# Patient Record
Sex: Female | Born: 1935 | Race: White | Hispanic: No | Smoking: Never smoker
Health system: Southern US, Community
[De-identification: ages and names within clinical notes are randomized; demographics above are authoritative.]

## PROBLEM LIST (undated history)

## (undated) DIAGNOSIS — K219 Gastro-esophageal reflux disease without esophagitis: Secondary | ICD-10-CM

## (undated) DIAGNOSIS — E785 Hyperlipidemia, unspecified: Secondary | ICD-10-CM

## (undated) DIAGNOSIS — I341 Nonrheumatic mitral (valve) prolapse: Secondary | ICD-10-CM

## (undated) DIAGNOSIS — N289 Disorder of kidney and ureter, unspecified: Secondary | ICD-10-CM

## (undated) DIAGNOSIS — IMO0001 Reserved for inherently not codable concepts without codable children: Secondary | ICD-10-CM

## (undated) DIAGNOSIS — E039 Hypothyroidism, unspecified: Secondary | ICD-10-CM

## (undated) DIAGNOSIS — F0390 Unspecified dementia without behavioral disturbance: Secondary | ICD-10-CM

## (undated) DIAGNOSIS — R51 Headache: Secondary | ICD-10-CM

## (undated) DIAGNOSIS — I10 Essential (primary) hypertension: Secondary | ICD-10-CM

## (undated) DIAGNOSIS — R519 Headache, unspecified: Secondary | ICD-10-CM

## (undated) HISTORY — DX: Nonrheumatic mitral (valve) prolapse: I34.1

## (undated) HISTORY — DX: Unspecified dementia, unspecified severity, without behavioral disturbance, psychotic disturbance, mood disturbance, and anxiety: F03.90

## (undated) HISTORY — DX: Headache: R51

## (undated) HISTORY — DX: Hyperlipidemia, unspecified: E78.5

## (undated) HISTORY — DX: Essential (primary) hypertension: I10

## (undated) HISTORY — DX: Headache, unspecified: R51.9

## (undated) HISTORY — DX: Reserved for inherently not codable concepts without codable children: IMO0001

## (undated) HISTORY — DX: Hypothyroidism, unspecified: E03.9

## (undated) HISTORY — DX: Gastro-esophageal reflux disease without esophagitis: K21.9

---

## 1998-10-13 ENCOUNTER — Encounter: Payer: Self-pay | Admitting: Geriatric Medicine

## 1998-10-13 ENCOUNTER — Ambulatory Visit (HOSPITAL_COMMUNITY): Admission: RE | Admit: 1998-10-13 | Discharge: 1998-10-13 | Payer: Self-pay | Admitting: Geriatric Medicine

## 1998-11-27 ENCOUNTER — Emergency Department (HOSPITAL_COMMUNITY): Admission: EM | Admit: 1998-11-27 | Discharge: 1998-11-27 | Payer: Self-pay | Admitting: Emergency Medicine

## 1999-03-24 ENCOUNTER — Emergency Department (HOSPITAL_COMMUNITY): Admission: EM | Admit: 1999-03-24 | Discharge: 1999-03-24 | Payer: Self-pay | Admitting: Emergency Medicine

## 1999-03-24 ENCOUNTER — Encounter: Payer: Self-pay | Admitting: Emergency Medicine

## 1999-08-17 ENCOUNTER — Encounter: Payer: Self-pay | Admitting: Emergency Medicine

## 1999-08-17 ENCOUNTER — Inpatient Hospital Stay (HOSPITAL_COMMUNITY): Admission: EM | Admit: 1999-08-17 | Discharge: 1999-08-18 | Payer: Self-pay | Admitting: *Deleted

## 1999-12-19 ENCOUNTER — Ambulatory Visit (HOSPITAL_COMMUNITY): Admission: RE | Admit: 1999-12-19 | Discharge: 1999-12-19 | Payer: Self-pay | Admitting: Cardiology

## 1999-12-19 ENCOUNTER — Encounter: Payer: Self-pay | Admitting: Cardiology

## 2000-02-13 ENCOUNTER — Encounter: Payer: Self-pay | Admitting: Emergency Medicine

## 2000-02-13 ENCOUNTER — Inpatient Hospital Stay (HOSPITAL_COMMUNITY): Admission: EM | Admit: 2000-02-13 | Discharge: 2000-02-14 | Payer: Self-pay | Admitting: Emergency Medicine

## 2001-07-18 ENCOUNTER — Encounter: Payer: Self-pay | Admitting: Emergency Medicine

## 2001-07-18 ENCOUNTER — Emergency Department (HOSPITAL_COMMUNITY): Admission: EM | Admit: 2001-07-18 | Discharge: 2001-07-18 | Payer: Self-pay | Admitting: Emergency Medicine

## 2001-11-25 ENCOUNTER — Emergency Department (HOSPITAL_COMMUNITY): Admission: EM | Admit: 2001-11-25 | Discharge: 2001-11-25 | Payer: Self-pay | Admitting: Emergency Medicine

## 2001-11-25 ENCOUNTER — Encounter: Payer: Self-pay | Admitting: Emergency Medicine

## 2002-03-24 ENCOUNTER — Encounter: Payer: Self-pay | Admitting: Orthopaedic Surgery

## 2002-03-30 ENCOUNTER — Inpatient Hospital Stay (HOSPITAL_COMMUNITY): Admission: RE | Admit: 2002-03-30 | Discharge: 2002-04-06 | Payer: Self-pay | Admitting: Orthopaedic Surgery

## 2002-04-06 ENCOUNTER — Inpatient Hospital Stay (HOSPITAL_COMMUNITY)
Admission: AD | Admit: 2002-04-06 | Discharge: 2002-04-10 | Payer: Self-pay | Admitting: Physical Medicine & Rehabilitation

## 2002-08-14 ENCOUNTER — Emergency Department (HOSPITAL_COMMUNITY): Admission: EM | Admit: 2002-08-14 | Discharge: 2002-08-14 | Payer: Self-pay | Admitting: Emergency Medicine

## 2002-08-19 ENCOUNTER — Inpatient Hospital Stay (HOSPITAL_COMMUNITY): Admission: RE | Admit: 2002-08-19 | Discharge: 2002-08-27 | Payer: Self-pay | Admitting: Cardiology

## 2002-08-19 ENCOUNTER — Encounter: Payer: Self-pay | Admitting: Cardiology

## 2002-08-20 ENCOUNTER — Encounter: Payer: Self-pay | Admitting: Cardiology

## 2002-08-21 ENCOUNTER — Encounter: Payer: Self-pay | Admitting: Gastroenterology

## 2002-08-30 ENCOUNTER — Emergency Department (HOSPITAL_COMMUNITY): Admission: EM | Admit: 2002-08-30 | Discharge: 2002-08-30 | Payer: Self-pay | Admitting: Emergency Medicine

## 2003-01-02 ENCOUNTER — Encounter: Payer: Self-pay | Admitting: *Deleted

## 2003-01-02 ENCOUNTER — Emergency Department (HOSPITAL_COMMUNITY): Admission: EM | Admit: 2003-01-02 | Discharge: 2003-01-02 | Payer: Self-pay | Admitting: *Deleted

## 2003-03-26 ENCOUNTER — Encounter: Payer: Self-pay | Admitting: Emergency Medicine

## 2003-03-26 ENCOUNTER — Emergency Department (HOSPITAL_COMMUNITY): Admission: EM | Admit: 2003-03-26 | Discharge: 2003-03-26 | Payer: Self-pay | Admitting: Emergency Medicine

## 2003-07-06 ENCOUNTER — Emergency Department (HOSPITAL_COMMUNITY): Admission: EM | Admit: 2003-07-06 | Discharge: 2003-07-06 | Payer: Self-pay | Admitting: Emergency Medicine

## 2003-07-06 ENCOUNTER — Encounter: Payer: Self-pay | Admitting: Emergency Medicine

## 2004-03-22 ENCOUNTER — Emergency Department (HOSPITAL_COMMUNITY): Admission: EM | Admit: 2004-03-22 | Discharge: 2004-03-22 | Payer: Self-pay | Admitting: Emergency Medicine

## 2004-05-06 ENCOUNTER — Inpatient Hospital Stay (HOSPITAL_COMMUNITY): Admission: EM | Admit: 2004-05-06 | Discharge: 2004-05-08 | Payer: Self-pay | Admitting: Emergency Medicine

## 2004-07-30 ENCOUNTER — Emergency Department (HOSPITAL_COMMUNITY): Admission: EM | Admit: 2004-07-30 | Discharge: 2004-07-30 | Payer: Self-pay | Admitting: Emergency Medicine

## 2004-12-21 ENCOUNTER — Emergency Department (HOSPITAL_COMMUNITY): Admission: EM | Admit: 2004-12-21 | Discharge: 2004-12-21 | Payer: Self-pay | Admitting: Emergency Medicine

## 2005-01-22 ENCOUNTER — Emergency Department (HOSPITAL_COMMUNITY): Admission: EM | Admit: 2005-01-22 | Discharge: 2005-01-22 | Payer: Self-pay | Admitting: Emergency Medicine

## 2005-03-15 ENCOUNTER — Inpatient Hospital Stay (HOSPITAL_COMMUNITY): Admission: EM | Admit: 2005-03-15 | Discharge: 2005-03-20 | Payer: Self-pay | Admitting: Emergency Medicine

## 2005-10-15 ENCOUNTER — Emergency Department (HOSPITAL_COMMUNITY): Admission: EM | Admit: 2005-10-15 | Discharge: 2005-10-16 | Payer: Self-pay | Admitting: Emergency Medicine

## 2005-11-03 ENCOUNTER — Observation Stay (HOSPITAL_COMMUNITY): Admission: EM | Admit: 2005-11-03 | Discharge: 2005-11-04 | Payer: Self-pay | Admitting: Emergency Medicine

## 2007-06-02 ENCOUNTER — Encounter (INDEPENDENT_AMBULATORY_CARE_PROVIDER_SITE_OTHER): Payer: Self-pay | Admitting: Gastroenterology

## 2007-06-02 ENCOUNTER — Inpatient Hospital Stay (HOSPITAL_COMMUNITY): Admission: EM | Admit: 2007-06-02 | Discharge: 2007-06-09 | Payer: Self-pay | Admitting: Emergency Medicine

## 2007-06-12 ENCOUNTER — Emergency Department (HOSPITAL_COMMUNITY): Admission: EM | Admit: 2007-06-12 | Discharge: 2007-06-12 | Payer: Self-pay | Admitting: Emergency Medicine

## 2007-07-18 ENCOUNTER — Emergency Department (HOSPITAL_COMMUNITY): Admission: EM | Admit: 2007-07-18 | Discharge: 2007-07-18 | Payer: Self-pay | Admitting: Emergency Medicine

## 2008-08-07 IMAGING — CR DG CHEST 1V PORT
1 series · 1 of 1 positions shown · non-contrast
Comparison: 11/03/05.

CLINICAL DATA: 76-year-old, G.I. bleed. 
 PORTABLE CHEST:

[view not recorded]
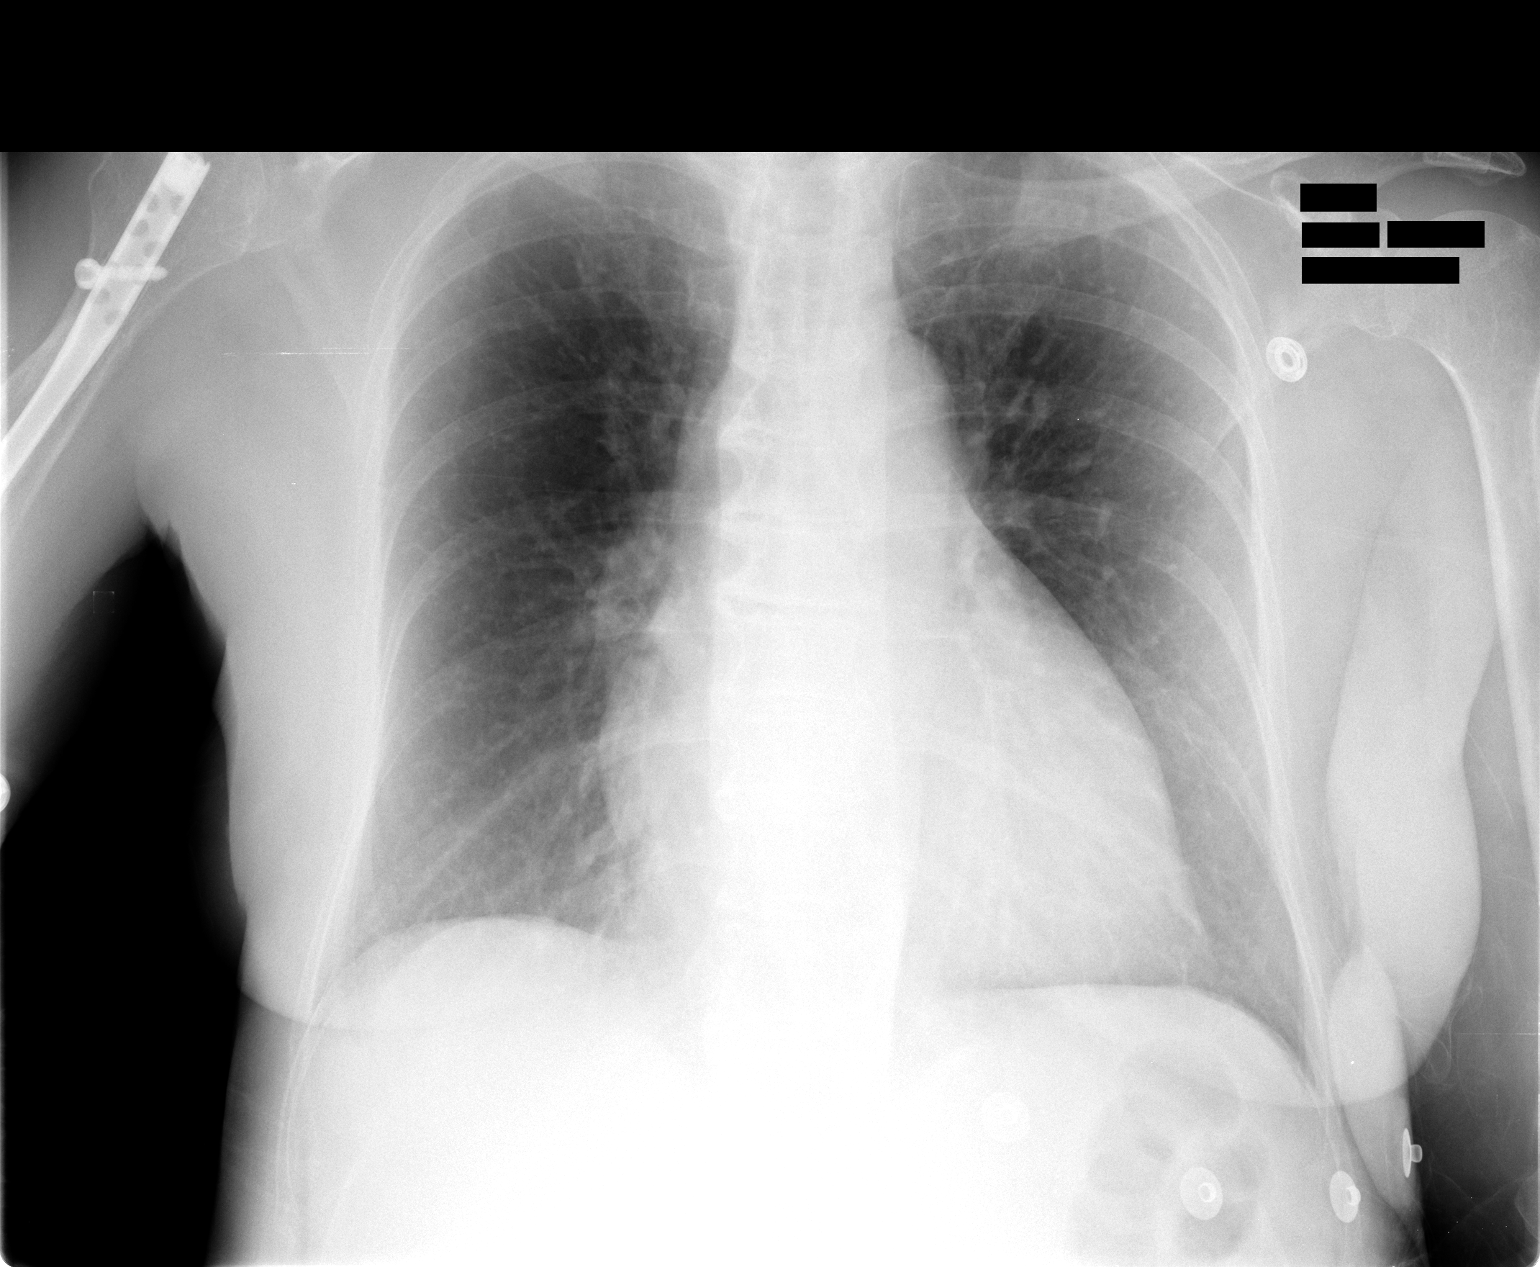

[1 of 1 positions shown; findings below may reference images not displayed]

FINDINGS: Heart is borderline in size.  Mediastinal and hilar contours are within normal limits.  Mild chronic lung changes/COPD but no acute pulmonary findings.  Slight increased density associated with both 1st ribs is a stable finding.  An intramedullary rod is noted in the right humerus.
IMPRESSION: 1.  Borderline heart size.
 2.  Chronic lung changes but no acute pulmonary findings.

## 2009-07-28 ENCOUNTER — Emergency Department (HOSPITAL_COMMUNITY): Admission: EM | Admit: 2009-07-28 | Discharge: 2009-07-29 | Payer: Self-pay | Admitting: Emergency Medicine

## 2009-12-23 ENCOUNTER — Observation Stay (HOSPITAL_COMMUNITY): Admission: EM | Admit: 2009-12-23 | Discharge: 2009-12-24 | Payer: Self-pay | Admitting: Emergency Medicine

## 2010-06-18 ENCOUNTER — Ambulatory Visit: Payer: Self-pay | Admitting: Cardiology

## 2010-11-30 ENCOUNTER — Encounter: Payer: Self-pay | Admitting: Cardiology

## 2010-11-30 DIAGNOSIS — E785 Hyperlipidemia, unspecified: Secondary | ICD-10-CM | POA: Insufficient documentation

## 2010-11-30 DIAGNOSIS — I341 Nonrheumatic mitral (valve) prolapse: Secondary | ICD-10-CM | POA: Insufficient documentation

## 2010-11-30 DIAGNOSIS — F0390 Unspecified dementia without behavioral disturbance: Secondary | ICD-10-CM | POA: Insufficient documentation

## 2010-11-30 DIAGNOSIS — I10 Essential (primary) hypertension: Secondary | ICD-10-CM | POA: Insufficient documentation

## 2010-11-30 DIAGNOSIS — E039 Hypothyroidism, unspecified: Secondary | ICD-10-CM | POA: Insufficient documentation

## 2010-12-19 ENCOUNTER — Ambulatory Visit: Payer: Self-pay | Admitting: Cardiology

## 2011-02-08 LAB — URINE CULTURE: Culture: NO GROWTH

## 2011-02-08 LAB — POCT I-STAT, CHEM 8
Calcium, Ion: 1.06 mmol/L — ABNORMAL LOW (ref 1.12–1.32)
Chloride: 105 mEq/L (ref 96–112)
Glucose, Bld: 116 mg/dL — ABNORMAL HIGH (ref 70–99)
HCT: 44 % (ref 36.0–46.0)
Hemoglobin: 15 g/dL (ref 12.0–15.0)
TCO2: 28 mmol/L (ref 0–100)

## 2011-02-08 LAB — URINALYSIS, ROUTINE W REFLEX MICROSCOPIC
Ketones, ur: 15 mg/dL — AB
Nitrite: NEGATIVE
Protein, ur: 30 mg/dL — AB
pH: 6.5 (ref 5.0–8.0)

## 2011-02-22 LAB — POCT I-STAT, CHEM 8
BUN: 27 mg/dL — ABNORMAL HIGH (ref 6–23)
Calcium, Ion: 1.1 mmol/L — ABNORMAL LOW (ref 1.12–1.32)
Chloride: 106 mEq/L (ref 96–112)
Creatinine, Ser: 0.8 mg/dL (ref 0.4–1.2)
Glucose, Bld: 110 mg/dL — ABNORMAL HIGH (ref 70–99)
HCT: 41 % (ref 36.0–46.0)
Hemoglobin: 13.9 g/dL (ref 12.0–15.0)
Potassium: 4 meq/L (ref 3.5–5.1)
Sodium: 139 mEq/L (ref 135–145)
TCO2: 25 mmol/L (ref 0–100)

## 2011-03-13 ENCOUNTER — Other Ambulatory Visit: Payer: Self-pay | Admitting: *Deleted

## 2011-03-13 DIAGNOSIS — R609 Edema, unspecified: Secondary | ICD-10-CM

## 2011-03-13 MED ORDER — FUROSEMIDE 20 MG PO TABS: ORAL_TABLET | ORAL | Status: AC

## 2011-03-13 NOTE — Telephone Encounter (Signed)
Received a fax from [facility] regarding patient

## 2011-03-15 ENCOUNTER — Encounter: Payer: Self-pay | Admitting: Cardiology

## 2011-04-02 NOTE — Consult Note (Signed)
NAMECHARLIE, CHAR                ACCOUNT NO.:  [id number]   MEDICAL RECORD NO.:  [id number]          PATIENT TYPE:  INP   LOCATION:  5120                         FACILITY:  [facility]   PHYSICIAN:  Antonietta Breach, M.D.  DATE OF BIRTH:  05/04/1930   DATE OF CONSULTATION:  DATE OF DISCHARGE:  06/09/2007                                 CONSULTATION   ADDENDUM   RECOMMENDATIONS:  Would continue her Paxil 20 mg q.day for anti-  depression/prevention.      Antonietta Breach, M.D.  Electronically Signed     JW/MEDQ  D:  06/10/2007  T:  06/11/2007  Job:  [id number]

## 2011-04-02 NOTE — Discharge Summary (Signed)
NAMEHEAVYN, YEARSLEY                ACCOUNT NO.:  [id number]   MEDICAL RECORD NO.:  [id number]          PATIENT TYPE:  INP   LOCATION:  5120                         FACILITY:  [facility]   PHYSICIAN:  Petra Kuba, M.D.    DATE OF BIRTH:  11-03-1930   DATE OF ADMISSION:  06/02/2007  DATE OF DISCHARGE:  06/09/2007                               DISCHARGE SUMMARY   ADDENDUM:  Please copy discharge summary to Dr. Lesia Sago as well as  Dr. Vida Rigger.      Stephani Police, PA    ______________________________  Petra Kuba, M.D.    MLY/MEDQ  D:  06/09/2007  T:  06/10/2007  Job:  [id number]   cc:   Marlan Palau, M.D.  Petra Kuba, M.D.

## 2011-04-02 NOTE — Op Note (Signed)
[name], [name] NO.:  [id number]   MEDICAL RECORD NO.:  [id number]          PATIENT TYPE:  EMS   LOCATION:  MAJO                         FACILITY:  [facility]   PHYSICIAN:  Petra Kuba, M.D.    DATE OF BIRTH:  October 23, 1930   DATE OF PROCEDURE:  06/02/2007  DATE OF DISCHARGE:                               OPERATIVE REPORT   PROCEDURE:  Esophagogastroduodenoscopy with biopsy.   INDICATION:  Upper gastrointestinal bleeding.  Consent was signed after  risks, benefits, methods, options thoroughly discussed with both the  patient and her daughter multiple times in the past.   MEDICINES USED:  Fentanyl 75 mcg, Versed 7.5 mg.   PROCEDURE:  The video endoscope was inserted by direct vision.  She had  lots of old brown to black fluid in her esophagus.  A lot was suctioned  up.  We did advance into the stomach and tried to suction more fluid but  continued to clog the scope.  She began coughing so we elected to  withdraw back to the esophagus, continued suctioning and then removed  the scope.  We waited for her to stop coughing.  Her oxygenation  remained fine and we reinserted the scope.  The esophagus was normal.  She did have a small to medium sized hiatal hernia.  Again, there was  lots of old blood and food debris in the stomach, probably a liter was  suctioned throughout the procedure.  We did advance to the antrum and  she had a few tiny to small antral ulcers, but in her pylorus, she had a  moderate sized peripyloric ulcer.  There was one black spot which could  not be washed off or made to bleed.  We were able to advance through the  pylorus into a normal duodenal bulb and a normal second portion of the  duodenum.  No blood was seen distally.  The scope was withdrawn back to  the bulb and again a good look there ruled out abnormalities in that  location.  The scope was withdrawn back to the pylorus and again the  ulcer was washed and watched and could not be  made to bleed.  We then  continued to wash and suction some of the fluid to get a better look at  the stomach, unfortunately, I could not clear all of the fluid and food  debris but the remainder of the stomach seen on straight and retroflexed  visualization did not reveal any additional findings.  We went ahead and  took two biopsies of the antrum and two of the proximal stomach to rule  out Helicobacter.  Again, we did wash the ulcer at the peripylorus one  more time, could not make it bleed.  We tried to suction a little more  fluid and then elected to withdraw.  Again a good look at the esophagus  and hiatal hernia pouch were normal. No obvious Mallory-Weiss tear or  other abnormalities.  The scope was removed.  The patient tolerated the  procedure well.  There was no obvious immediate complication.  ENDOSCOPIC DIAGNOSES:  1. Moderate hiatal hernia.  2. Old blood and food in the stomach, a liter suctioned but some still      remaining.  3. Peripyloric ulcers and edema with one black spot, could not be made      bleeding with passing the scope or washing.  No visible vessels or      adverse lesions.  4. Normal bulb and second portion of the duodenum without signs of      active bleeding, status post gastric biopsy and antral biopsy to      rule out Helicobacter.   PLAN:  We will watch her in stepdown.  Very slowly advance diet but keep  on ice chips and sips now.  IV Protonix transfused.  Follow labs and  stools.  Rescope p.r.n. particularly to evaluate the proximal part of  the stomach which could not be seen.  We will need a stronger dose of  pump inhibitors in the future since her one a day Prilosec did not  prevent this issue.  I have discussed all of the above with the daughter  and Dr. Bosie Clos will see her tomorrow.           ______________________________  Petra Kuba, M.D.     MEM/MEDQ  D:  06/02/2007  T:  06/03/2007  Job:  [id number]   cc:   Cassell Clement,  M.D.  Marlan Palau, M.D.

## 2011-04-02 NOTE — H&P (Signed)
[name], [name]                ACCOUNT NO.:  [id number]   MEDICAL RECORD NO.:  [id number]          PATIENT TYPE:  INP   LOCATION:  5120                         FACILITY:  [facility]   PHYSICIAN:  Petra Kuba, M.D.    DATE OF BIRTH:  10-Aug-1930   DATE OF ADMISSION:  06/02/2007  DATE OF DISCHARGE:                              HISTORY & PHYSICAL   HISTORY:  The patient was admitted at the request of Dr. Lynelle Doctor  presenting to the emergency room with hypotension, hematemesis, near  syncope.  The patient had actually been doing quite well since I had  last seen her.  She has been out of the hospital without much GI  problems.  The family thought she had a GI bug with some nausea and  vomiting over the last 24 hours.  She does admit to seeing a black stool  2 or 3 days ago.  She has not been on any aspirin or non-steroidals.  She has been on her Prilosec once a day.  She does have known hiatal  hernia and ulcers in the past.  She began throwing up black material  uncontrollably although does not think she has had any further bowel  movements.  Her hemoglobin was 7.8 on the ISTATand her BUN was elevated.   PAST MEDICAL HISTORY:  Pertinent for CVA and dementia with migraines  followed by Dr. Anne Hahn.  She also has thyroid problems, high blood  pressure followed by Dr. Patty Sermons as well as cholecystectomy,  hysterectomy, multiple lysis of adhesions and small bowel obstructions  as well as a knee replacement.   FAMILY HISTORY:  Negative for any obvious significant GI problems.   ALLERGIES:  CAINE MEDICINES   MEDICATIONS GIVEN:  Include Prilosec, atenolol, Paxil, potassium,  Synthroid and HCTZ.   SOCIAL HISTORY:  Pertinent for no aspirin or non-steroidals as above.  I  do believe she no longer smokes or drinks.   REVIEW OF SYSTEMS:  Pertinent for no urinary complaints, no cold  symptoms, no cough and 2 days ago was in her normal state of health.  She has been told she has been anemic.  She  has not been on any iron  pills.  They are not sure of her last blood work.   PHYSICAL EXAMINATION:  GENERAL:  After IV fluids in the ER, she is doing  much better.  Her blood pressure is now elevated.  She is in no acute  distress.  HEENT:  Sclerae nonicteric.  LUNGS:  Clear.  HEART:  Regular rate and rhythm.  ABDOMEN:  Soft and nontender.  She was guaiac positive per the ER.   LABORATORY DATA:  Pertinent include a hemoglobin of 7.8, normal coag,  increased BUN.  Normal potassium, sodium and creatinine.  Other labs  pending at the time of dictation.  White count was elevated.   ASSESSMENT:  1. Upper gastrointestinal bleed in patient with known hiatal hernia      and history of ulcers.  2. History of cerebrovascular accident with some dementia and      migraines followed by Dr.  Willis.  3. Thyroid problems.  4. High blood pressure followed by Dr. Patty Sermons.  5. Multiple surgeries including cholecystectomy, hysterectomy and      lysis of adhesions, small bowel obstructions and knee replacement.   PLAN:  We will try to transfuse 2 units of blood.  Rehydrate as we are  going.  IV Protonix.  Go ahead and proceed with endoscopy ASAP.  Since  stable we will proceed now with further work up and plans pending as per  findings.           ______________________________  Petra Kuba, M.D.     MEM/MEDQ  D:  06/02/2007  T:  06/03/2007  Job:  [id number]   cc:   Marlan Palau, M.D.  Cassell Clement, M.D.

## 2011-04-02 NOTE — Consult Note (Signed)
NAME:  [name], [name]                ACCOUNT NO.:  [id number]   MEDICAL RECORD NO.:  [id number]          PATIENT TYPE:  INP   LOCATION:  5120                         FACILITY:  [facility]   PHYSICIAN:  Pramod P. Pearlean Brownie, MD    DATE OF BIRTH:  [birth year]   DATE OF CONSULTATION:  06/08/2007  DATE OF DISCHARGE:                                 CONSULTATION   REASON FOR REFERRAL:  Is confusion and headache.   HISTORY OF PRESENT ILLNESS:  [name] is a 77 year old Caucasian lady  who was admitted with hematemesis and low hemoglobin count and found to  have ulcers  related to chronic pain state, Keppra, NSAID usage due to  frequent headaches.  The patient herself is denying the above symptoms.  She states she is in the hospital because of headaches.  She has had  headaches off and on for last several years and takes frequent dosages  of the Goody Powders and Motrin as well as Fioricet at least  3-4 days a  week and 2-4 tablets at the time for headaches.  She is unable to  provide any history about her cognitive problems and memory  difficulties.  This history is obtained through the chart.  The patient  apparently has had memory problems past 3-4 years she was seen in our  office by Dr. Thad Ranger and subsequently by Dr. Anne Hahn for confusion  episodes and initially these episodes were thought by Dr. Thad Ranger to be  transient global amnesia.  However, over time these episodes became more  frequent and  Dr. Thad Ranger felt she had a mild underlying dementia.   The patient, however, was never been tried on __________ medications.  She states headache, some mild to moderate, and they respond to the  NSAIDs that she takes and then go away for a day or two, but then come  back.  She does not recall being tried on prophylactic medications like  Depakote, Topamax or amitriptyline.  She denies any history of stroke,  TIA.   PAST MEDICAL HISTORY:  1. Chronic headaches.  2. Colitis.  3. Hypertension.  4.  Hypothyroidism.  5. Mitral valve prolapse.  6. Peptic ulcer.   PAST SURGICAL HISTORY:  1. Right total knee replacement.  2. Gallbladder surgery.   ALLERGIES TO MEDICATIONS:  PENICILLIN, CODEINE.   HOME MEDICATIONS:  Atenolol, levothyroxine, Protonix, Paxil, Fioricet,  Tylenol, Darvocet   FAMILY HISTORY:  Noncontributory.  The patient cannot provide.   REVIEW OF SYSTEMS:  Positive for headache, dizziness, confusion, memory  loss, sundowning.   PHYSICAL EXAMINATION:  GENERAL:  A pleasant elderly Caucasian lady who  is not in distress.  VITAL SIGNS:  She is afebrile, pulse rate 70, respiratory rate 16, blood  pressure 121/56, sats 93% on room air.  HEENT:  nontraumatic.  NECK:  Supple without bruits.  There is mild decreased hearing  bilaterally.  CARDIAC:  Regular heart sounds.  LUNGS:  Clear to auscultation.  ABDOMEN:  Soft, nontender with exam.  NEUROLOGIC:  The patient is awake, alert, oriented to time, place and  person.  She has diminished  attention, registration, and recall.  On the  mini-mental status exam, she is code 80 out of 30 with deficits in  orientation, recall, attention and a copy intersecting pentagons.  On  the clock drawing she scored 2/4.  On animal naming test, she only  scored 6.  Eye movements are full range without nystagmus.  Visual  acuity and fields adequate.  Face is symmetric.  Palatal movements  normal.  Tongue is midline.  Motor system exam reveals no upper  extremity drift.  She has symmetric strength, tone, reflexes including  ankle jerks.  Plantars are downgoing.  Coordination is slow but accurate  bilaterally.  Her gait was not tested.   LABORATORY DATA:  Data reviewed was CT scan of the head done June 07, 2007, shows some mild generalized atrophy and small-vessel disease  changes.  No acute abnormalities seen.  MRI scan of the brain lesion in  May 2006 also shows similar findings.  EEG done in May 2006 as well as  June 2005 both  unremarkable.  RPR, vitamin B12, folic acid, TSH and an  ammonia in May 2006 all normal.   IMPRESSION:  A 77 year old lady with chronic daily headaches,  refractory, represents an __________  rebound headaches.  Chronic  confusional state which is likely mild Alzheimer's dementia.   PLAN:  I would recommend discontinuing all NSAIDs including Fioricet,  Darvocet, Motrin, and Goody Powder since she has clearly had  allergies,  overuse and ulcers as well as worsening headaches from this.  Instead  start Depakote ER 500 mg a day which will help both chronic daily  headaches as well as sundowning behavior.  She may perhaps need a trial  of medications for dementia but this can wait and be done electively as  an outpatient with Dr. Anne Hahn.  I would recommend the patient follow-up  with Dr. Anne Hahn after discharge for the same.  Thank you for referral.           ______________________________  Sunny Schlein. Pearlean Brownie, MD     PPS/MEDQ  D:  06/08/2007  T:  06/09/2007  Job:  [id number]

## 2011-04-02 NOTE — Discharge Summary (Signed)
NAMEQUANEISHA, HANISCH                ACCOUNT NO.:  [id number]   MEDICAL RECORD NO.:  [id number]          PATIENT TYPE:  INP   LOCATION:  5120                         FACILITY:  [facility]   PHYSICIAN:  Petra Kuba, M.D.    DATE OF BIRTH:  Apr 23, 1930   DATE OF ADMISSION:  06/02/2007  DATE OF DISCHARGE:                               DISCHARGE SUMMARY   ADMITTING DIAGNOSES:  1. Upper gastrointestinal bleed.  2. Anemia secondary to upper gastrointestinal bleed.  3. Mild dementia with organic brain syndrome.  4. History of cerebrovascular accident.  5. Migraines.  6. Hypothyroidism.  7. Hypertension.  8. Status post cholecystectomy.  9. Status post hysterectomy.  10.History of small-bowel obstructions  11.Status post right total knee replacement.  12.History of hiatal hernia.  13.Status post gallbladder surgery.   DISCHARGE DIAGNOSES:  1. Pyloric ulcer with partial obstruction.  2. Anemia secondary to peptic ulcer  3. History of cerebrovascular accident.  4. Mild dementia with organic brain syndrome.  5. Migraines.  6. Hypothyroidism.  7. Hypertension.  8. Status post cholecystectomy.  9. Status post hysterectomy.  10.History of small-bowel obstructions  11.Status post right total knee replacement.  12.Hiatal hernia.   She is on gastroenterology service with Dr. Vida Rigger as her attending.   CONSULTATIONS:  1. Pramod P. Pearlean Brownie, MD of the neurology service.  2. Case management and social work.   PROCEDURES:  EGD with biopsy on June 02, 2007, by Dr. Vida Rigger.  Indication was upper gastrointestinal bleeding. Endoscopic diagnoses  included moderate hiatal hernia, peripyloric ulcers and edema with one  black spot. No visible vessels or adverse lesions.  Pathology showed  minimal chronic gastritis. No H. pylori, dysplasia, or malignancy.   BRIEF HISTORY AND PHYSICAL:  The patient was admitted at the request of  Dr. Lynelle Doctor, presenting to the emergency room with hypotension,  hematemesis and near syncope.  She has been vomiting coffee-ground  emesis for approximately 24 hours and seeing black stools for the past 2-  3 days prior to admission. Initially she denies nonsteroidals but when  questioned at a later date, she admitted to taking Aleve on a regular  basis for her migraine headaches. On admission, her hemoglobin was 7.8,  and her BUN was elevated. The patient was transfused two units of packed  red blood cells and gently rehydrated. IV Protonix was started.  Endoscopy was performed. After endoscopy, the patient was moved to the  step-down unit and watched carefully.  As her GI bleed resolved, she was  then moved to the general medical floor. As her GI bleed quickly  resolved, it became evident that the patient was pleasantly confused and  disoriented.  She pulled out her IV several times, was wandering out to  the hallway, and was not oriented to place or time. A sitter was  requested and stayed with her for several days.   The patient began having headaches. A head CT was performed without  contrast on July 20.  Impression showed no acute intracranial findings,  stable cortical volume loss, and small vessel ischemic disease.  A  neurology consult was called.  The patient was seen by Dr. Delia Heady.  He recommended discontinuing all NSAIDs including Fioricet, Darvocet,  Motrin, and Goody powders. He initiated Depakote 500 mg once a day which  should help both chronic daily headaches as well as sundowning behavior.  He recommended outpatient followup with the patient's neurologist, Dr.  Lesia Sago, and possibly a trial of Aricept as an outpatient. Due to  the patient's ongoing confusion, the progression nurse initiated a  social work and case management consult. Assisted living facility offers  were made to the family who declined them. The patient was evaluated for  skilled nursing care but was found to have no skilled nursing  requirements of care,  but it was felt that she was too independent. The  patient's daughter, Christ Kick was provided with community resources  by social work and case management. The daughter expressed  disappointment in that her mother's mental status continues to decline,  but at this point, insurance would not cover rest home care and  rehabilitation. The daughter, Tresa Endo, agrees to take the patient to her  aunt's house so that her mother will not be home alone. In order to  assist the family and promote the patient's safety, social work and case  work recommended home health safety evaluation and will each send a  representative to visit Mrs. Pekar's home to assist in assuring her  safety.   Today, July 22, the patient is in good physical condition.  She is in no  apparent distress.  She is awake and appropriate. Her heart has regular  rate and rhythm with no murmurs, rubs or gallops.  Her lungs are clear  to auscultation bilaterally.  Her abdomen is soft, nontender,  nondistended with good bowel sounds.  The patient reports to me that she  is having regular bowel movements.  She is tolerating a full diet,  ambulating around her room very well.  As a matter of fact, she is  cleaning her room herself.   Current labs show a hemoglobin of 10.4, hematocrit 31.7, white count  9.0, and platelets 314,000. After conversing with the daughter about  options for her mother, she has opted to take the mother to her aunt's  house, so we will discharge the patient to her daughter's care in good  condition.   DISCHARGE MEDICATIONS:  1. Atenolol 75 mg daily.  2. Paxil 20 mg 1 tablet daily.  3. Synthroid 150 mcg 1 tablet daily.  4. Protonix 40 mg 1 tablet twice daily.  5. Depakote 500 mg 1 tablet daily.  6. Tramadol 50 mg 2 tablets q.6 h p.r.n. headache.   DISCHARGE INSTRUCTIONS:  1. Absolutely no NSAIDs including Aleve, Alka-Seltzer, ibuprofen, or      aspirin  2. The patient has a followup appoint with Dr.  Vida Rigger on the [facility] [place]      [facility] on August 14 at 2:30 p.m. as well as      a followup appointment with Dr. Lesia Sago at [facility]      tentatively scheduled for August 26 at 11:30 a.m., but we will try      to move that appointment up sooner pending the doctor and the      patient's schedule availability.      Stephani Police, PA    ______________________________  Petra Kuba, M.D.    MLY/MEDQ  D:  06/09/2007  T:  06/09/2007  Job:  [id number]

## 2011-04-02 NOTE — Consult Note (Signed)
[name], [name] NO.:  [id number]   MEDICAL RECORD NO.:  [id number]          PATIENT TYPE:  INP   LOCATION:  5120                         FACILITY:  [facility]   PHYSICIAN:  Antonietta Breach, M.D.  DATE OF BIRTH:  1929-11-25   DATE OF CONSULTATION:  06/09/2007  DATE OF DISCHARGE:  06/09/2007                                 CONSULTATION   The requesting physician is Vida Rigger.   REASON FOR CONSULTATION:  Mental status changes, assess, recommend  treatment.   HISTORY OF PRESENT ILLNESS:  Ms. [name] is a 77 year old female  admitted to the [facility] on June 02, 2007 due to hypotension  and hematemesis.   The patient has been exhibiting confusion, impairment in memory and  impairment in judgment.  She does not have depressed mood.  She has no  thoughts of harming herself or others.  She has no hallucinations or  delusions   The patient is cooperative with bedside care.  Her appetite is adequate.  She ate 95% of her breakfast, 100% of her lunch.  She describes comfort  from her religious faith.  She has partial insight into her memory  dysfunction.   PAST PSYCHIATRIC HISTORY:  The patient is reported to have had a history  of depression as well as treatment for depression.   On review of the past medical record, the patient was treated for  depression as early as May 2003 with Paxil 20 mg daily, and depression  is noted in her discharge summary in May 2003.   The patient was placed on Pamelor 20 mg every night as of May 2004.   In June 2005, the patient was evaluated by neurology for mental status  changes.  The patient had developed some transient global amnesia and  was evaluated with toxic delirium.  She was discharged on Paxil.   The patient again developed a period of confusion in May of 2006.   The patient does have a history of dementia mentioned in the past  medical record and a trial of Aricept 10 mg daily.   FAMILY PSYCHIATRIC  HISTORY:  None known.   SOCIAL HISTORY:  Ms. [name] is divorced.  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  She has no history  of alcohol or illegal drugs.  She has three children.  She was living by  herself.   PAST MEDICAL HISTORY:  1. Pyloric ulcer with partial obstruction.  2. Hiatal hernia.  3. History of right total knee replacement.  4. Hypertension.  5. Status post cholecystectomy.  6. History of hysterectomy.  7. History of small bowel obstruction.  8. Hypothyroidism.  9. Hypertension.  10.Migraine headache.  11.Anemia due to peptic ulcer disease.  12.Cerebrovascular disease with history of cerebrovascular accident.  13.Dementia.   The patient has been started on Depakote 500 mg every night for migraine  headaches.   MEDICATIONS:  MAR is reviewed.  The patient is on Paxil 20 mg daily for  antidepression/prevention.  SHE HAS ALLERGIES TO PENICILLIN, CODEINE AND  BENZOCAINE.  WBC 9.0, hemoglobin 10.4, platelet count 314.  Basic metabolic panel is  unremarkable.  Her calcium is 8.8 which is within normal limits.  On  June 03, 2007, the patient's SGOT was normal at 19, SGPT normal at 13,  albumin decreased at 2.6.   Head CT without contrast on July 20 showed no acute intracranial  finding.  The patient showed stable cortical volume loss and small  vessel ischemic disease.   REVIEW OF SYSTEMS:  CONSTITUTIONAL:  Afebrile.  No weight loss.  HEAD:  No trauma.  EYES:  No visual changes.  EARS:  No hearing impairment.  NOSE:  No rhinorrhea.  MOUTH/THROAT:  No sore throat.  NEUROLOGIC:  As  mentioned above.  The patient has undergone multiple neurological  evaluation including a neurological consultation during this admission.  She did have an RPR, vitamin B12, folic acid, TSH and ammonia assessment  in May 2006, and they were normal.  Neurology has assessed the patient  during this hospitalization to have chronic daily headaches.   These have  been refractory to treatment and involve rebound headaches.  They have  started her on Depakote extended release 500 mg every night.  PSYCHIATRIC:  As above.  CARDIOVASCULAR:  No chest pain or palpitations.  RESPIRATORY:  No coughing or wheezing.  GASTROINTESTINAL:  No nausea,  vomiting, diarrhea.  GENITOURINARY:  No dysuria.  SKIN:  Unremarkable.  ENDOCRINE/METABOLIC:  Unremarkable.  HEMATOLOGIC/LYMPHATIC:  Anemia as  above.  MUSCULOSKELETAL:  No deformities.   PHYSICAL EXAMINATION:  VITAL SIGNS:  Temperature 98.6, pulse 88,  respirations 18, blood pressure 108/54, O2 saturation on room air 93%.  GENERAL APPEARANCE:  [name] is an elderly female appearing her  chronologic age sitting up in her hospital bed with good eye contact.  She has a normal body habitus.  There are no abnormal involuntary  movements.  She is well-groomed.  OTHER MENTAL STATUS EXAM:  [name] is alert.  Her eye contact is  good.  Her attention span is partially decreased.  Her concentration is  partially decreased apparently secondary to her short-term memory  deficit.  On orientation testing, she is oriented to person as well as  the hospital.  She has obvious difficulties with memory, and on specific  testing, she names 3/3 immediate but 0/3 at 3 minutes.  Her speech  involves normal rate and prosody without dysarthria.  Her fund of  knowledge and intelligence are below that of her estimated premorbid  baseline.   Thought process is coherent without looseness of associations.  She is  logical and goal directed.  She does have intact language comprehension  and expression.  Thought content -  no thoughts of harming herself, no  thoughts of harming others, no delusions, no hallucinations.  Her affect  is mildly constricted at baseline but with a broad appropriate response  as the interview progresses.  Her mood is within normal limits.  Her  insight is partial for knowing that she is having  difficulty with  memory.  Her judgment is impaired.   ASSESSMENT:  AXIS I:  294.9 - unspecified persistent mental disorder,  not otherwise specified.  This likely represents a primary dementia,  although she does have a history of a cerebral vascular accident.  293.83 - mood disorder not otherwise specified, depressed (history of  likely general medical as well as functional factors).  Rule out 296.35 - major depressive disorder recurrent, in partial  remission.  AXIS II:  None.  AXIS  III:  See general medical problems.  AXIS IV:  General medical.  AXIS V:  40.   Ms. Pardoe does demonstrate significant impairments in memory and  judgment.  She does not have the capacity for informed consent.   RECOMMENDATIONS:  1. The undersigned concurs with the patient being discharged into an      environment where she can have 24 hours per 7 day supervision due      to her memory dysfunction.  2. The medication Zyprexa has been utilized p.r.n.  Would recommend      not using the Zyprexa unless hallucinations and delusions return      and then would use it in a standing regimen.  3. If it is confirmed that the Aricept has had an adequate trial,      would consider a Namenda trial for cognitive and memory enhancement  4. Regarding follow up, psychiatric outpatient follow-up can be      obtained at one of the clinics assigned to [facility],      [facility] or [facility].      Antonietta Breach, M.D.  Electronically Signed     JW/MEDQ  D:  06/10/2007  T:  06/11/2007  Job:  [id number]

## 2011-04-05 NOTE — Discharge Summary (Signed)
[place]  Patient:    [name], [name] [facility] Visit Number: [id number] MRN: [id number]          Service Type: [facility] Location: [id number] Attending Physician:  Herold Harms Dictated by:   Arnoldo Morale, P.A. Admit Date:  04/06/2002 Discharge Date: 04/10/2002   CC:         [name], M.D.   Discharge Summary  ADMITTING DIAGNOSES: 1. End-stage osteoarthritis, right knee. 2. Hypothyroidism. 3. Gastroesophageal reflux disease. 4. Hiatal hernia. 5. Mitral valve prolapse with history of angina. 6. History of peptic ulcer disease.  DISCHARGE DIAGNOSES: 1. End-stage osteoarthritis right knee, status post right knee replacement. 2. Acute blood loss anemia secondary to surgery. 3. Hypokalemia. 4. Hypothyroidism. 5. Gastroesophageal reflux disease. 6. Hiatal hernia. 7. Mitral valve prolapse with history of angina. 8. History of peptic ulcer disease.  SURGICAL PROCEDURE:  On Mar 30, 2002, the patient underwent a right total knee arthroplasty by Dr. Claude Manges. [place] assisted by Arnoldo Morale, P.A.-C.  COMPLICATIONS:  None.  CONSULTS: 1. Cardiology consult by Dr. Patty Sermons on Mar 30, 2002, in addition to a    pharmacy consult for Coumadin therapy. 2. Case management consult, Mar 31, 2002, in addition to a PT consult. 3. Occupational therapy consult, Apr 01, 2002. 4. Rehab medicine consult, Mar 31, 2002.  HISTORY OF PRESENT ILLNESS:  This 77 year old white female patient presented to Dr. Cleophas Dunker with a history of 20 years of intermittent right knee pain. She has a history of arthroscopies in the past and a tibia fracture which has been treated successfully.  The pain in her right knee is a constant aching over the anterior joint line with radiation into her buttock and distally into her ankle.  It increases with walking and decreases with rest.  She does have to wear over a pullover knee sleeve to help support the knee.  She has  failed conservative treatment and, because of this, she is presenting for a right total knee replacement.  HOSPITAL COURSE:  The patient tolerated her surgical procedure well without immediate postoperative complications.  In the recovery room, she did have some complaints of chest pressure and Dr. Patty Sermons was consulted and he followed her closely from a cardiac standpoint for her entire hospitalization. He did adjust her medicines on May 14 and monitored her closely.  On postoperative day #1, she was afebrile, vital signs stable.  Leg was neurovascularly intact.  Her potassium was low and that was supplemented, and she was started on therapy per protocol.  On postoperative day #2, she still complained of slight chest pressure, but it was unchanged and all her workup had been negative.  Right knee wound was well approximated.  Potassium was increasing.  She was switched to p.o. pain medications and continued on therapy.  Over the next several days, she continued to have intermittent chest pressure which did respond to nitroglycerin.  Her medications were adjusted appropriately.  She had mild complaints of dysuria which urinalysis did not show a urinary tract infection.  Hemoglobin and hematocrit remained stable around 75 ______.  She did not require transfusion.  She continued to make slow progress with physical therapy and it was felt she would benefit from a course of rehab.  A rehab bed did become available on Apr 06, 2002, and she was able to be transferred at that time.  She did have one episode of migraine headache on May 20 which was treated effectively with Fiorinal.  DISCHARGE INSTRUCTIONS:  Diet:  She can continue her current hospitalization diet.  Medications:  She is to continue her current hospitalization medications with adjustments to be made per the rehab physicians.  Activity:  She is to be out of bed, partial weightbearing 50% or less on the right leg with the  use of the walker.  She is to have PT and OT per rehab protocols.  Wound care:  Please clean incision with Betadine q. day.  Staples can be removed with Steri-Strips applied with Benzoin on Sunday after her transfer to rehab.  Please notify Dr. Cleophas Dunker of temperature greater than 101.5, chills, pain unrelieved by pain medications, or foul-smelling drainage from the wound.  Follow-up:  She is to follow up with Dr. Cleophas Dunker in our office in approximately two weeks and needs to call [phone] to set up that appointment.  LABORATORY DATA:  On May 14, hemoglobin 9.4, hematocrit 27.7.  On May 15, white count 11.5, hemoglobin 9.8, hematocrit 28.8.  On May 16, white count 13.3, hemoglobin 10.1, hematocrit 29.  On May 17, white count 11.5, hemoglobin 9.6, hematocrit 27.9.  On May 18, hemoglobin 10, hematocrit 29.7.  On May 15, PT 16.6, INR 1.4.  On May 19, PT 25.9, INR 2.9.  On May 14, potassium 3, glucose 142, calcium 8.  On May 15, potassium 3.4, glucose 134.  On May 17, sodium 135, potassium 3.7, chloride 98, CO2 29, glucose 130, BUN 9, creatinine 0.8, and calcium 8.5.  On May 7, alkaline phosphatase was 164.  All other laboratory studies were within normal limits. Dictated by:   Arnoldo Morale, P.A. Attending Physician:  Herold Harms DD:  04/30/02 TD:  05/02/02 Job: [id number]

## 2011-04-05 NOTE — Procedures (Signed)
NEUROLOGIC CONSULTANT:  Dr. Lesia Sago.   HISTORY OF PRESENT ILLNESS:  The patient was recorded by EEG on Mar 19, 2005,  after having developed a state of confusion and delirium.  The patient was  not exposed to hyperventilation but photic stimulation measurements were  recorded.   CONCLUSION:  This 16-channel EEG recording was one channel __________  exclusively, was performed at the EEG lab.  The patient is described as  awake and compliant with the instructions given by the technician.  A  posterior dominant rhythm is determined at 7 Hertz over both posterior  hemispheres.  The amplitude for the left occipital region is lower than on  the right.  There is remarkable little motion artifact noticed.  The EKG  shows a normal sinus rhythm of 64-74 beats per minute.   Photic stimulation did lead to photic entrapment at rate of 7, 9, 11, 13 and  15 Hertz without provocation of any seizure activity.  During all  provocation maneuvers, there was symmetric and synchronous brain activity  seen, and no focal abnormality or localized discharges were noticed.   CONCLUSION:  This is a normal EEG for the patient's age and conscious state.      JW:[id number]  D:  03/19/2005 19:05:50  T:  03/20/2005 47:82:95  Job #:  [id number]

## 2011-04-05 NOTE — Discharge Summary (Signed)
[place]  Patient:    [name], [name]                       MRN: [id number] Adm. Date:  [id number] Disc. Date: [id number] Attending:  Rudean Hitt CC:         Alvia Grove., M.D.             [name], M.D.                           Discharge Summary  FINAL DIAGNOSES:  1. Chest pain with normal coronary arteries.  2. Past history of suspected mitral valve prolapse.  3. Hypokalemia secondary to hydrochlorothiazide.  4. Status post cholecystectomy.  5. Compensated hypothyroidism.  6. Postmenopausal state, on estrogen replacement therapy.  7. Hyperlipidemia, on Lipitor.  OPERATION/PROCEDURE: Left heart cardiac catheterization by Dr. Kristeen Miss on February 14, 2000.  HISTORY OF PRESENT ILLNESS: This 77 year old Caucasian female was admitted with chest pain.  She has a past history of suspected mitral valve prolapse as well as a history of palpitations.  She has had chest pain and had a normal adenosine Cardiolite scan on September 11, 1999 after hospitalization for chest pain and an MI was ruled out at that time.  Today she awoke with chest pain with radiation down the left arm not responsive to nitroglycerin at home.  EMS was called and she was brought to the emergency room.  PHYSICAL EXAMINATION:  GENERAL: Physical examination on admission was essentially unremarkable.  VITAL SIGNS: Blood pressure was 110/60, pulse 72.  CHEST: Clear.  CARDIAC: There was a faint mid systolic click.  No murmurs, rubs, or gallops.  ABDOMEN: Soft, nontender.  EXTREMITIES: Good pulses.  LABORATORY DATA: Initial potassium was low at 3.3.  Initial CK-MB and troponin I were normal.  HOSPITAL COURSE: The patient was placed on IV nitroglycerin and IV heparin, aspirin, and beta-blockers.  Serial enzymes were obtained.  Enzymes came back negative.  The patient was seen by Dr. Kristeen Miss the following morning and agreed with  indications for catheterization.  The catheterization was carried out and showed normal left ventriculogram, normal left ventricular function, and normal coronaries.  She tolerated the procedure well and was able to be discharged home that evening.  DISCHARGE MEDICATIONS:  1. Continue all medications as directed.  2. Increase potassium 10 mEq to t.i.d.  FOLLOW-UP: She will see Dr. Patty Sermons in one week for follow-up.  DISCHARGE INSTRUCTIONS: She is to watch for groin bleeding.  DISCHARGE DIET: Low-fat/low-cholesterol diet.  DISCHARGE CONDITION: Improved. DD:  03/13/00 TD:  03/14/00 Job: [id number]

## 2011-04-05 NOTE — Discharge Summary (Signed)
[name], [name]                       ACCOUNT NO.:  [id number]   MEDICAL RECORD NO.:  [id number]                   PATIENT TYPE:  INP   LOCATION:  5524                                 FACILITY:  [facility]   PHYSICIAN:  Cassell Clement, M.D.              DATE OF BIRTH:  [birth year]   DATE OF ADMISSION:  08/19/2002  DATE OF DISCHARGE:  08/27/2002                                 DISCHARGE SUMMARY   FINAL DIAGNOSES:  1. Headache.  2. Mitral valve prolapse.  3. Nausea with vomiting.  4. Hypertensive cardiovascular disease.  5. Hypokalemia.  6. Status post knee joint replacement.  7. Abnormal liver function tests.   OPERATION:  None.   HISTORY:  This is a 77 year old woman admitted with severe headache and  pernicious nausea and vomiting. She presents with an intractable history of  headache. She does have a past history of atypical chest pain and suspected  mitral valve prolapse and had a normal catheterization 02/13/00. She has had  a remote history of cholecystectomy by Dr. Rolene Course in 1986. Over the week  prior to admission, she has had severe daily bifrontal headaches associated  with nausea. Two nights ago, she was taken to the emergency room because of  the headaches and nausea, and she was given medicine and sent home. Since  being home, she has been having some visual hallucinations according to her  daughter. The patient came to the office today for a previously scheduled  routine office visit and was complaining of severe headache and nausea, and  it was felt that the patient would need a stat CT scan. Lab work was sent  off at the time, and we did get a CT with contrast, and metabolic panel  returned with multiple abnormalities including potassium of 3.2, alkaline  phosphatase of 678, SGOT of 49, SGPT of 42. Subsequent CT scan of the head  was negative except for some dilatation of the intrahepatic bile ducts on  the abdominal CT scan. The patient was admitted for IV  fluids and further  treatment of the nausea, vomiting, and headache.   PAST MEDICAL HISTORY:  She has had a history of hyperlipidemia. She has had  a past history of hyperthyroidism. She has a history of depression.   PHYSICAL EXAMINATION:  VITAL SIGNS:  Blood pressure is 120/70, pulse 72,  weight 155-down 12 pounds from 5/03.  HEENT:  Pupils are equal and reactive to light. Fundi are normal.  NECK:  Carotids normal. Jugular venous pressure normal. Thyroid normal.  CHEST:  Clear.  HEART:  Reveals a quiet precordium without murmur, gallop, rub, or click.  BREASTS:  Reveal no masses.  ABDOMEN:  Soft and nontender.  EXTREMITIES:  The extremities show no phlebitis or edema. She has thick  ankles. Pedal pulses are present.  NEUROLOGICAL:  Physiological.   HOSPITAL COURSE:  The patient was started on IV fluids, and she was given  potassium supplementation. Her nausea was treated with Phenergan. Serial  liver enzymes were obtained. Urinalysis was requested to account for lower  abdominal symptoms. Dr. Danise Edge was asked to the see the patient for  GI consult regarding her elevated liver function studies. He noted the  marked alkaline phosphatase elevation with mild transaminase elevation and  also marked elevation in her sed rate. He felt that she might benefit from a  MR cholangiopancreatography to rule out biliary stones or stricture or  tumor. Sed was noted to be markedly elevated at 118. Dr. Kellie Simmering was asked  to the patient for rheumatology consultation. The patient did have the MR  cholangiopancreatography which was normal. Dr. Kellie Simmering raised the question  of possible temporal arteritis but felt that the symptoms were atypical for  that. He gave her a trial of prednisone which did not respond like a normal  temporal arteritis patient. Dr. Lesia Sago was asked to see the patient  regarding her severe headaches.  He suggested a Doppler of the external  carotid system to look for  halo sign for temporal arteritis. This was done,  and there was no sign of any halo sign. The patient did get a trial of DHE  infusion as suggested by neurology. She did develop some chest pain during  the DHE infusions. The patient, however, was relieved by oxygen. A repeat  sed rate showed improved at 85, reflecting the previous short course of  steroids. Activity was gradually increased. Headaches gradually improved,  and by 08/27/02, she was well enough to be discharged home. At the time of  discharge, her anti-mitochondrial antibody level was still pending. Her  dysuria was improved on Cipro. She was afebrile at the time of discharge. We  did stop her Zocor as per Dr. Clarisa Kindred suggestion.   LABORATORY DATA:  Alkaline phosphatase on admission 619, on discharge 486.  AST on admission 46, rose to 64, and was 50 at discharge. ALT 38, 44, 40,  and 59. Bilirubins were normal. Electrolytes were normal except for mildly  elevated blood sugars, ranging from 100 to 180. Serum protein  electrophoresis showed a nonspecific pattern with a high level of gamma  globulin. Her total CK was low at 14. CK-MB was normal. TSH was elevated at  12.08. T4 was normal at 6.2. C-reactive protein was elevated at 1.7 with a  normal up to 0.8. Urinalysis showed a positive nitrite, and urine culture  grew greater than 100,000 E coli responding to Cipro. Rheumatoid factor was  less than 20. A 5'-nucleotidase was elevated at 58 with a reference range of  0 to 15. Anti-mitochondrial antibody was normal at 0.1. Antinuclear antibody  was negative.   DISCHARGE MEDICATIONS:  The patient was discharged improved on the following  regimen:  1. Premarin 0.3 mg one daily.  2. Atenolol 25 mg twice a day.  3. Paxil 20 mg one daily.  4. K-Dur 20 mEq four times a day.  5. Pyridium 100 mg as needed for dysuria.  6. Synthroid 0.2 mg daily.  7. Topamax 25 mg at bedtime for headache prevention. 8. Cipro 250 mg twice a day for  six more days.  9. Darvocet N-100 as needed for pain.  10.      Nitroglycerin 1/150 sublingually chest pain.   DISCHARGE INSTRUCTIONS:  She is to walk as tolerated. She will be on a low  cholesterol diet. She is to stop her statin drug which was Lipitor  previously to the hospitalization. She  is to see Dr. Patty Sermons in two to  three weeks  for office visit, CMET, and sed rate, and she will also see Dr. Anne Hahn in  several weeks. She will follow up p.r.n. with Dr. Kellie Simmering.   CONDITION ON DISCHARGE:  Improved.                                               Cassell Clement, M.D.    TB/MEDQ  D:  09/22/2002  T:  09/23/2002  Job:  [id number]   cc:   Aundra Dubin, M.D.  [address]  [place]  [place] [address]  Fax: 1   Danise Edge, M.D.  [address]  [place]  [place] [address]  Fax: [phone]

## 2011-04-05 NOTE — Discharge Summary (Signed)
[name], [name]                ACCOUNT NO.:  [id number]   MEDICAL RECORD NO.:  [id number]          PATIENT TYPE:  INP   LOCATION:  5502                         FACILITY:  [facility]   PHYSICIAN:  Cassell Clement, M.D. DATE OF BIRTH:  1930/04/20   DATE OF ADMISSION:  03/15/2005  DATE OF DISCHARGE:  03/20/2005                                 DISCHARGE SUMMARY   FINAL DIAGNOSES:  1.  Abdominal pain, resolved.  2.  Status post cholecystectomy.  3.  Status post hysterectomy.  4.  Status post right total knee replacement.  5.  Essential hypertension.  6.  Hyperlipidemia.  7.  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  8.  Chronic headaches.  9.  Transient elevation of liver function studies, resolved.  10. Atypical chest pain with normal cardiac catheterization several years      ago.  11. Suspected mitral valve prolapse by history.  12. Osteoarthritis.   OPERATIONS PERFORMED:  None.   HISTORY:  This 77 year old woman was admitted as an emergency on March 15, 2005, by Dr. Viann Fish because of abdominal pain. She had complaints of  chest and abdominal pain and had a prior normal cardiac catheterization  in  February 2001 by Dr. Delane Ginger. She was hospitalized in February 2003 with  chronic headaches and some visual hallucination and, at that time, have a  markedly elevated alkaline phosphatase. She had MR cholangiopancreatography  at that time which was normal. Because of her headaches, there was  entertained the diagnosis of temporal arteritis but a trial of prednisone  did not help. She has been followed as an outpatient in our office and by  neurology. Ten weeks ago she had an emergency room visit for atypical chest  pain and was discharged from the ER after negative enzymes. Today, she  called EMS complaining of chest pain, and by the time she got here was  complaining of lower abdominal pain. EKG in the emergency room was  unremarkable. CT scan of the abdomen showed  questionable thickened loop of  bowel in the sigmoid, and the patient was admitted.   On physical examination, vital signs were normal.  The lungs were clear.  Heart reveals no click.  The abdomen was minimally tender in the left lower  quadrant but no rebound. Extremities show trace edema. Neurologic exam is  physiologic.   HOSPITAL COURSE:  Initial white count and hemoglobin were normal. Initial  chemistry panel showed slight elevation of alkaline phosphatase but  otherwise normal liver enzymes, and the CT scan was mildly abnormal as noted  above. She was admitted to the regular floor, was kept n.p.o. initially, and  Dr. Roosvelt Harps was asked to see for help in the workup of her GI  complaints. Dr. Luther Parody noted that her white count, lack of fever,  negative guaiac stool, and lack of diarrhea did not suggest a diagnosis of  colitis, and he suspected that the CT finding was spurious. He advised  observation on clear fluids with serial liver enzymes. By the following day,  the patient was having difficulty with remembering. She had  what appeared to  be transient global amnesia for the entire morning of April 30. While in the  hospital, first serial liver function tests showed significant elevation  with SGOT of 113, alkaline phosphatase 213, SGPT of 66.  Dr. Luther Parody felt  that the liver function elevation could be biliary versus viral. She  underwent an MR angiogram which did not show any definite abnormalities.  Because of her transient global amnesia, she was seen in consultation by Dr.  Lesia Sago who suspected that she might have mild underlying organic brain  syndrome. Her liver function studies were followed serially. Her peak AST  was 96 and was 29 at discharge. Her ALT was 66 and had fallen to 34 at  discharge. Bilirubin of 2.0 had fallen to 0.4 at discharge, and alkaline  phosphatase of 235 had fallen to 194 at discharge. The patient remained  afebrile. White count was  normal. By May 3, she was having less problem with  her thinking, and it was felt that it would be timely to allow her to be  discharged home to be followed closely on outpatient basis. She will call  Dr. Anne Hahn' office for followup, and she will see Dr. Patty Sermons in the  office in 2 weeks for followup office visit, fasting lipid panel and CMET.   DISCHARGE MEDICATIONS:  1.  Synthroid 0.150 one daily.  2.  Premarin 0.3 mg daily.  3.  Hydrochlorothiazide 25 mg daily.  4.  Micro-K Ten 2 tablets daily.  5.  Atenolol 50 mg,  taking 1-1/2 daily.  6.  Paxil 20 mg daily.  7.  Protonix 40 mg twice a day.  8.  Fiorinal 1 or 2 every 6 hours p.r.n. for headache.  9.  Tylenol as needed.   ACTIVITY:  She will walk for exercise.   DIET:  She is to be on a low-fat, low-cholesterol diet.   CONDITION ON DISCHARGE:  Improved.      TB/MEDQ  D:  03/20/2005  T:  03/20/2005  Job:  [id number]   cc:   Marlan Palau, M.D.  [address]  [address]  [place]  [place] [address]  Fax: [phone]   Althea Grimmer. Luther Parody, M.D.  [address], [address]  [place]  [place] [address]  Fax: [phone]

## 2011-04-05 NOTE — H&P (Signed)
NAME:  [name], [name]                          ACCOUNT NO.:  [id number]   MEDICAL RECORD NO.:  [id number]                   PATIENT TYPE:  INP   LOCATION:  5524                                 FACILITY:  [facility]   PHYSICIAN:  Maisie Fus A. Patty Sermons, M.D.           DATE OF BIRTH:  1930/09/12   DATE OF ADMISSION:  08/19/2002  DATE OF DISCHARGE:                                HISTORY & PHYSICAL   CHIEF COMPLAINT:  Severe headache and pernicious nausea and vomiting.   HISTORY OF PRESENT ILLNESS:  This is a 77 year old Caucasian female admitted  with intractable headache and nausea and vomiting.  She has a past history  of atypical chest pain and suspected mitral valve prolapse.  She had a  normal cardiac catheterization February 13, 2000 by Dr. Delane Ginger.  She has a  remote history of cholecystectomy by Dr. Rolene Course in 1986.  Over the past week  she has had severe daily steady bifrontal headaches associated with nausea.  Two nights ago she was taken to the emergency room because of the headaches  and nausea worsening and she was unable to keep anything down.  No  particular studies were done according to the patient but she was given  medication for nausea and sent home.  Since being home she has had some  problems with visual hallucinations yesterday according to the daughter.  She was already scheduled for routine office visit in our office today and  came to the office where she was complaining of severe headache and nausea.  It was felt that the patient needed a stat CT scan of the head and we also  sent off stat lab work so that we could get contrast at the time of CT.  The  comprehensive metabolic panel came back with multiple abnormalities  including sodium of 133, potassium 3.2, chloride 91, CO2 30, BUN 15,  creatinine 1.0, alk phos 678, SGOT 49, SGPT 42, albumin 2.9.  The patient  had subsequent CT scan of the head here at [facility] which showed no tumor or  other acute abnormalities and CT  scan of the abdomen was essentially  unremarkable except for some dilatation of the intrahepatic bile ducts.  The  patient is admitted now for further evaluation as well as treatment of her  nausea, vomiting and headache.   PAST SURGICAL HISTORY:  Tonsillectomy, appendectomy, hysterectomy.   SOCIAL HISTORY:  She lives alone.  She is retired.  She has a daughter who  works in Museum/gallery curator at [employer].  She does not use alcohol or tobacco.   ALLERGIES:  She is allergic to penicillin and codeine.   PAST MEDICAL HISTORY:  Past medical history also reveals that she has had a  history of hyperlipidemia and is on Lipitor.  Previous liver function  studies have been essentially normal on Lipitor, most recent being in May  2003. Her alk phos was 146, SGOT 18,  SGPT 14 on Lipitor.  The patient has a  history of hyperparathyroidism and is on Synthroid and she has a history of  depression and is on Paxil.   REVIEW OF SYSTEMS:  Otherwise noncontributory.   MEDICATIONS:  1. Hydrochlorothiazide 25 mg daily.  2. Premarin 0.3 mg daily.  3. Synthroid 0.15 mg daily.  4. Nitrostat sublingual p.r.n.  5. Phenobarbital p.r.n.  6. Riopan p.r.n.  7. Atenolol 50 mg 1-1/2 tablets daily.  8. Ventolin inhaler p.r.n.  9. Tagamet 200 mg p.r.n.  10.      Fiorinal p.r.n.  11.      Advil p.r.n.  12.      Extra-strength Tylenol p.r.n.  13.      Paxil 20 mg daily.  14.      Darvocet-N 100 p.r.n. pain.  15.      Lipitor 40 mg daily.  16.      Micro-K 10 one three times a day.   PHYSICAL EXAMINATION:   VITAL SIGNS:  Blood pressure 120/70, pulse 72 and regular, respirations  normal, weight 165-1/2, down from 177 in May 2003.   HEENT:  Pupils are equal and reactive.  Extraocular movements are full.  Fundi show no hemorrhages, exudates or micro aneurysms.  Mouth and pharynx  are normal. Tongue normal.   NECK:  Carotids normal.  JVP normal.  Thyroid normal.  There is no  lymphadenopathy.   CHEST:  Clear to percussion  and auscultation.   HEART:  Quiet precordium without murmur, gallop, rub, click.   BREASTS:  No masses.   ABDOMEN:  Soft, nontender.  There is no mass but she does complain of  suprapubic fullness reminiscent of a previous urinary tract infection.   EXTREMITIES:  No phlebitis or edema.  Ankles are thick.  Pedal pulses are  present.   NEUROLOGIC:  Normal deep tendon reflexes and normal sensory.   DIAGNOSTIC IMPRESSION:  1. Severe headache of uncertain etiology with negative CT scan today.  2. Pernicious nausea and vomiting, uncertain etiology, with marked increase     in alkaline phosphatase and mild increase in SGOT and  SGPT.  There are     dilated intrahepatic bile duct on CT scan today and she is status post     cholecystectomy in 1986.  3. Essential hypertension.  4. Hypokalemia, questionably secondary to nausea and vomiting or thiazide     diuretics.  5. Hypothyroidism.  6. History of mitral valve prolapse.  7. History of depression on Paxil.  8. History of hyperlipidemia on Lipitor.    DISPOSITION:  Treat with IV fluids with potassium.  Give Phenergan p.r.n.  for nausea.  Will get an upper abdominal ultrasound in the a.m. and ask GI  to see her in the a.m.  Will get serial liver chemistries.  We will also get  a urinalysis and culture to be sure she does not have an occult urinary  tract infection to account for the lower abdominal symptoms.                                                 [name], M.D.    TAB/MEDQ  D:  08/19/2002  T:  08/19/2002  Job:  [id number]

## 2011-04-05 NOTE — Consult Note (Signed)
NAME:  [name], [name]                          ACCOUNT NO.:  [id number]   MEDICAL RECORD NO.:  [id number]                   PATIENT TYPE:  INP   LOCATION:  5524                                 FACILITY:  [facility]   PHYSICIAN:  Aundra Dubin, M.D.            DATE OF BIRTH:  02/01/1930   DATE OF CONSULTATION:  08/20/2002  DATE OF DISCHARGE:                                   CONSULTATION   CHIEF COMPLAINT:  Headaches.   HISTORY:  The patient is a 77 year old white female with a long history of  headaches, hypothyroidism, and mitral valve prolapse.  She had a right TKR  in 5/03.  She has lost about 20 pounds since this time, by her report.  Sometime in July she began having severe headaches, which she attributed to  her usual pattern, but they were more severe.  These worsened over August  into September.  About two weeks ago she was having daily headaches that was  not very relieved with numerous narcotic doses.  She describes it as a  pounding headache.  The headache is generally over both eyes, into the  forehead, to the hairline.  She denies any in the temporal area.  She has  had no jaw claudication or vision changes.  She has had a significant amount  of nausea and vomiting with a slight degree of diarrhea.  Her labs on  08/19/02 show an ESR 118, sodium 133, potassium 3.2, chloride 91, alkaline  phosphatase 678 and AST 49, ALT 42, albumin 2.9, calcium 8.7.  She feels  that she has had some fever during the last few days.   There has been no swollen joints.  She is aching a slight degree on the top  of the left shoulder and some in the upper arm, but not on the right.  There  is no thigh or buttocks achiness.  She denies having a sore throat.  There  has been no rashes, chest pain, shortness of breath, numbness or tingling to  extremities, alopecia, pleuritic chest pain.  Further review of systems was  negative.   PAST MEDICAL/SURGICAL HISTORY:  1. Right knee TKR 5/03.  2.  Mitral valve prolapse.  3. She had a catheterization in 3/01 that was essentially normal.  4. Cholecystectomy.  5. Appendectomy.  6. Tonsillectomy.  7. Hysterectomy.  8. She also had a head CT since being admitted to the hospital on 08/19/02,     which is unremarkable.  9. Hypothyroidism.   CURRENT MEDICATIONS:  1. Premarin 0.3 mg q.d.  2. Synthroid 150 mcg q.d.  3. Atenolol 25 mg b.i.d.  4. Paxil 20 mg q.d.  5. Zocor 80 mg q.d.  6. Potassium 40 mEq t.i.d.  7. Darvocet p.r.n.  8. Phenergan p.r.n.  9. Albuterol p.r.n.  10.      Pepcid 20 mg q.d.    DRUG INTOLERANCES:  1. PENICILLIN.  2.  CODEINE.   FAMILY HISTORY:  Noncontributory.   SOCIAL HISTORY:  She lives alone and has a daughter who works here at [employer].  She does not smoke or drink alcohol.   PHYSICAL EXAMINATION:  VITAL SIGNS:  Temperature on 08/20/02 was 100.2 and  later 98.3.  Blood pressure 110/50, respirations 16.  GENERAL:  She gives a good history and appears to be somewhat uncomfortable  by this headache.  SKIN:  Essentially clear.  HEENT:  PERRL/EOMI, mouth clear.  NECK:  Negative JVD.  There is slight upper cervical adenopathy that is  tender.  Her thyroid was slightly enlarged and also tender.  LUNGS:  Clear.  HEART:  Regular with no distinctive murmur.  ABDOMEN:  Negative HSM.  Nontender.  MUSCULOSKELETAL:  Hands, wrists, elbows, shoulders, neck had good range of  motion and showed no active arthritis.  The right knee is replaced and  flexes well to 100 degrees.  The left knee is cool and nontender.  She had  some trace edema.  The ankles and lower legs are puffy, and these areas were  nontender.  The feet were nontender.  NEUROLOGIC:  Strength 5/5, DTR's 2+ throughout, negatives SLR with A&O x 3.   ASSESSMENT AND PLAN:  1. Severe headache with an ESR of 118.  She is a 77 year old woman who has     had a change in the character of her headaches and this is significantly     worsened.  She now has some  mild fever along with the significant nausea     and vomiting.  This has the likelihood of being temporal arteritis.  I am     electing to place her on prednisone 30 mg b.i.d.  If this is temporal     arteritis, then I would suspect these headaches and her nausea to     improve.  She could possibly have a viral syndrome also, but that does     not explain the 2-3 month history of these worsening headaches.  At the     present time she does not seem to have symptoms of polymyalgia     rheumatica.  2. Elevated alkaline phosphatase.  By this __________ report that in PMR and     temporal arteritis, this could be elevated.  3. Elevated ALT and AST.  4. Electrolyte abnormalities.  I believe this reflects the fact that she has     been vomiting.  5. Low albumin.  6. Recent right total knee replacement.  7. Mitral valve prolapse.                                               Aundra Dubin, M.D.    WWT/MEDQ  D:  08/20/2002  T:  08/24/2002  Job:  [id number]

## 2011-04-05 NOTE — Consult Note (Signed)
NAME:  Lisa, Warner                       ACCOUNT NO.:  [id number]   MEDICAL RECORD NO.:  [id number]                   PATIENT TYPE:  EMS   LOCATION:  MAJO                                 FACILITY:  [facility]   PHYSICIAN:  Marlan Palau, M.D.               DATE OF BIRTH:  08/20/1930   DATE OF CONSULTATION:  03/26/2003  DATE OF DISCHARGE:  03/26/2003                                   CONSULTATION   NEUROLOGY EMERGENCY ROOM CONSULTATION:   HISTORY OF PRESENT ILLNESS:  [name] is a 77 year old right-handed  white female born August 13, 1930 with a history of chronic daily headaches.  This patient has been treated in the past with Topamax with little benefit  and was switched over to Pamelor taking 20 mg at night currently.  This  patient has responded to DHE 25 in the past, but recently has been on some  Midrin if needed.  Headaches, again, are daily in nature.  The patient has  had some cervical spine disease, has had some intermittent tingling in the  right hand thought secondary to carpal tunnel syndrome followed by Dr.  Cleophas Dunker for this.  This patient has come in today with an evaluation for  one of her usual chronic daily headaches, but the patient had enuresis this  morning which is unusual for her and has had some tingling sensations in the  fourth and fifth fingers of the right hand. The patient was brought to the  emergency room.  A CT scan of the brain was unremarkable and neurology was  called for further evaluation.   PAST MEDICAL HISTORY:  1. History of chronic daily headaches, frequent since her teenage years,     with recent daily headaches.  2. History of elevated sedimentation rate in the past, elevated alkaline     phosphatase.  3. History of right total knee replacement in May 2003.  4. Status post hysterectomy.  5. Gallbladder resection.  6. Tonsillectomy.  7. Mitral valve prolapse.  8. Hypothyroidism.  9. Appendectomy.  10.      Hyperlipidemia.  11.      Depression.   MEDICATIONS AT THIS TIME:  1. Pamelor 20 mg at night.  2. Premarin 0.3 mg daily.  3. Atenolol 25 mg b.i.d.  4. Paxil 20 mg a day.  5. Zocor 80 mg a day.  6. Potassium 40 mEq t.i.d.  7. Pyridium 100 mg b.i.d.  8. Synthroid 0.02 mg daily.  9. Phenergan if needed.   ALLERGIES:  PENICILLIN and CODEINE.   SOCIAL HISTORY:  Does not smoke or drink.  The patient lives in the  [place] area.  She is divorced; has 3 children all alive and well.  One  of her daughters also has positive headache.   FAMILY MEDICAL HISTORY:  Mother died with Parkinson's disease. Father died  with lung disease.  Patient's father had a history of headaches. One sister  died with cancer.   REVIEW OF SYSTEMS:  Notable for no recent fever or chills.  The patient has  not had any changes in vision, double vision, loss of vision, no troubles  with speech or swallowing, or neck pain.  The patient denies any chest pain,  shortness of breath, denies nausea or vomiting, denies any severe problems  with balance, has noted some slight changes; however, the patient has had no  weakness of the extremities.  No blackouts.   PHYSICAL EXAMINATION:  VITAL SIGNS:  Blood pressure is 121/56.  Heart rate  is 63; respiratory rate 18; temperature afebrile.  GENERAL:  In general this patient is a fairly well-developed, white female  who is alert and cooperative at this time.  HEENT:  Head is atraumatic.  Eyes:  Pupils are equal, round, and react to  light. Disks soft and flat bilaterally.  NECK:  Supple.  No carotid bruits noted.  CHEST:  Respiratory examination is clear.  CARDIOVASCULAR:  Reveals a regular rate and rhythm.  No obvious murmurs or  rubs noted.  EXTREMITIES:  Without significant edema.  NEUROLOGIC:  Cranial nerves as above.  The patient is pleasant.  The patient  has good sensation to pin prick, and soft touch bilaterally, has good  strength of the facial muscles and the muscles of the head  turn __________  bilaterally.  Speech is well enunciated.  No aphasic.  Motor testing was 5/5  strength in all 4 extremities.  Good symmetric motor tone is noted  bilaterally.  Sensory testing is intact to pin prick, soft touch  bilaterally, and sensation throughout.  Position sense was not tested.  The  patient has good finger-to-nose-finger, toe-to-finger bilaterally.  The  patient is able to ambulate.  Deep tendon reflexes are 2+ and symmetric,  toes neutral bilaterally.  The patient has no drift.  Again, speech is well  enunciated and not aphasic.   LABORATORY DATA:  Laboratory values are notable for sodium of 139, potassium  4.3, chloride of 109, BUN of 19, glucose of 95, CO2 of 42.7.  Hematocrit of  41, hemoglobin 14, Again, CT of the head is negative.  EKG reveals sinus  bradycardia with a heart rate of 58.   IMPRESSION:  1. History of chronic daily headaches.  2. Enuresis etiology unclear.  3. Left hand tingling, possibly related to carpal tunnel syndrome.    DISCUSSION:  There is no evidence of any cerebrovascular event at this time.  The patient has had daily headaches for some time. The patient claims that  this current headache is no different from her usual events.  We will  discharge this patient to home.  The patient is to take a low-dose aspirin.  We will follow up this patient, if needed, as an outpatient.                                               Marlan Palau, M.D.    CKW/MEDQ  D:  03/26/2003  T:  03/28/2003  Job:  [id number]   cc:   Cassell Clement, M.D.  [address], [address]  [place]  [place] [address]  Fax: [phone]   [facility]  [address]  [address]

## 2011-04-05 NOTE — H&P (Signed)
[name], [name]                ACCOUNT NO.:  [id number]   MEDICAL RECORD NO.:  [id number]          PATIENT TYPE:  INP   LOCATION:  1825                         FACILITY:  [facility]   PHYSICIAN:  Darden Palmer., M.D.DATE OF BIRTH:  04/16/1930   DATE OF ADMISSION:  03/15/2005  DATE OF DISCHARGE:                                HISTORY & PHYSICAL   REASON FOR ADMISSION:  Abdominal pain.   HISTORY:  This 77 year old female is admitted out of the emergency room  because of complaints of chest and abdominal pain.  She has a history of  atypical chest pain which is chronic and suspected mitral valve prolapse.  She has a prior normal cardiac catheterization in 2001, by Dr. Kristeen Miss.  She also has a remote cholecystectomy by Dr. Rolene Course in 1986.  She  has had significant difficulty with atypical chest pain.  She was  hospitalized, in 2003, at which time she had chronic headaches, visual  hallucinations, and had a severely elevated alkaline phosphatase at that  time.  She was seen by rheumatologist during that admission as well as a GI  consult and had a brief course of prednisone during that admission and was  eventually able to be discharged.  She was given IV fluids and potassium  supplementation and eventually had an MR cholangiopancreatography which was  normal.  There was a question of possible temporal arteritis but trial of  prednisone did not really help.  She was seen by neurology and then her  headaches gradually improved.  She was discharged home and has had some  chronic headaches since that time and has been followed by neurology.  She  had a transient admission for transient global amnesia a year or so later.  She normally lives alone at home.  She had an emergency room visit for  atypical chest pain several weeks ago and was discharged after negative  enzymes.  She called the ambulance today complaining of chest pain but by  the time she got here, was complaining of  lower abdominal pain.  Three sets  of enzymes were negative.  An EKG was unremarkable.  Her initial blood work  was normal but a CT scan showed a questionable thickened loop of bowel in  the sigmoid colon and I was asked to admit the patient since she sees Dr.  Cassell Clement for primary care.   PAST MEDICAL HISTORY:  1.  Hyperlipidemia.  She is supposed to be on Lipitor but it is crossed out      on her medicine list.  2.  She has a history of hypothyroidism and is on Synthroid.  3.  Prior history of depression.  4.  There is a history of transient global amnesia and chronic headaches.      \  5.  She also has mild hypertension.   PREVIOUS SURGICAL HISTORY:  1.  Tonsillectomy.  2.  Appendectomy.  3.  Hysterectomy.  4.  Cholecystectomy.  5.  And has also had a previous right total knee replacement by Dr. Theron Arista  [place].   ALLERGIES:  1.  PENICILLIN.  2.  CODEINE.   CURRENT MEDICATIONS:  1.  Paxil 20 mg daily.  2.  Fiorinal for headaches.  3.  Micro-K two tablets daily.  4.  HCTZ 25 mg daily.  5.  Synthroid 0.15 mg daily.  6.  Tagamet p.r.n.  7.  Premarin 0.3 mg daily.  8.  Atenolol 75 mg daily.   SOCIAL HISTORY:  She is divorced, has three children, two sons and a  daughter, currently lives by herself in a one-story home.  She is a  nonsmoker, does not use alcohol to excess.   FAMILY HISTORY:  Mother died at age 42 of coronary disease and Parkinson's.  Father died at age 25 with lung cancer and COPD.  One sister died.  Has  three children which are alive and well.   REVIEW OF SYSTEMS:  She has a prior history of chest pain, irregular heart  beat, and palpitations in the past and has used nitroglycerin previously.  She has had a remote history of pneumonia.  She uses Tagamet for indigestion  periodically.  She is relatively active.  She does have significant  arthritis and has had a previous knee replacement.  She has had significant  neurologic workups and  also has a history of depression.  She did not have  any diarrhea and her last bowel movement was reportedly normal.  There was  no bleeding.  There is no history of ulcers or gastrointestinal bleeding.  Other than as noted above, the remainder of the review of systems is  unremarkable.   PHYSICAL EXAMINATION:  GENERAL:  She is an elderly woman who appeared alert  and did not appear in any acute distress, although she was complaining of  significant pain but did not appear to be in that much pain.  VITAL SIGNS:  Her blood pressure was 130/80, pulse was 76 and regular.  SKIN:  Warm and dry.  ENT:  EOMI.  PERRLA.  C&S clear.  Fundi not examined.  Pharynx negative.  NECK:  Supple without masses, JVD, thyromegaly, or bruits.  LUNGS:  Clear to A&P.  CARDIOVASCULAR:  Showed normal S1 and S2.  There was no S3, S4, or click.  ABDOMEN:  Soft.  There is minimal tenderness in the left lower quadrant  without rebound.  Bowel sounds were present.  Femoral pulses were 2+.  Distal pulses 2+.  Trace edema is noted.  NEUROLOGIC:  Normal.   Her white count was normal, hemoglobin hematocrit were normal.  Chemistry  panel showed slight elevation of alkaline phosphatase but otherwise normal  liver enzymes.  Her EKG was normal.  CT scan shows mildly dilated sigmoid  and questionable thickened bowel wall.   IMPRESSION:  1.  Lower abdominal pain, rule out ischemic colitis, or inflammatory      colitis, or diverticulitis.  2.  Hypertension.  3.  Hyperlipidemia.  4.  Depression.  5.  History of transient global amnesia.  6.  Chronic headaches.  7.  History of elevated alkaline phosphatase with minimal elevation now.  8.  Atypical chest pain with negative cardiac enzymes and unremarkable      catheterization several years ago.  9.  History of osteoarthritis.   RECOMMENDATIONS:  1.  The patient's chest pain did not sound like cardiac ischemia.  Her EKG     is unremarkable.  Enzymes are negative.  2.  I  will keep her NPO, give IV fluids, the lab is  fairly unremarkable.  3.  I have asked for a gastroenterology consult to help with diagnosis of      possible diverticulitis or colitis.  4.  Keep NPO at the present time with IV fluids.      WST/MEDQ  D:  03/15/2005  T:  03/16/2005  Job:  [id number]   cc:   Cassell Clement, M.D.  [address], [address]  [place]  [place] [address]  Fax: [phone]   Althea Grimmer. Luther Parody, M.D.  [address], [address]  [place]  [place] [address]  Fax: [phone]

## 2011-04-05 NOTE — Discharge Summary (Signed)
[name], [name]                       ACCOUNT NO.:  [id number]   MEDICAL RECORD NO.:  [id number]                   PATIENT TYPE:  INP   LOCATION:  5524                                 FACILITY:  [facility]   PHYSICIAN:  Deanna Artis. Sharene Skeans, M.D.           DATE OF BIRTH:  September 05, 1930   DATE OF ADMISSION:  05/06/2004  DATE OF DISCHARGE:  05/08/2004                                 DISCHARGE SUMMARY   FINAL DIAGNOSES:  1. Transient global amnesia.  2. Toxic delirium.   PROCEDURES:  1. MRI of the brain.  2. EEG.   COMPLICATIONS:  None.   SUMMARY OF THE HOSPITALIZATION:  The patient is a 77 year old woman admitted  to the hospital with problems with her memory and also with confusional  state.  It seemed to be associated with being unable to recognize her own  home, thinking that she had been at work today, and speaking about deceased  relatives.   The patient has had a longstanding history of headaches.  Her other medical  problems include hypertension, hypothyroidism, postmenopausal state,  gastroesophageal reflux disease, and anxiety/depression.   The patient was admitted to the hospital.  MRI of the brain showed no acute  abnormalities.  EEG showed a 7-8 Hz background which is mildly slow but  otherwise well organized.  There was no seizure activity.   LABORATORY STUDIES:  From today:  Sodium 137, potassium 3.9, chloride 103,  CO2 26, BUN 17, creatinine 0.8, glucose 95, calcium 9.3.  CBC:  White count  8300, hemoglobin 13.3, hematocrit 39.6, MCV 91.3, platelets 218,000.  Laboratories on admission:  White count 12,000, hemoglobin 13, hematocrit  37.8, MCV 90.6, platelet count 244,000, 62 polys (7400 neutrophils), 29  lymphs, 7 monos, 2 eosinophils, 1 basophil.  Comprehensive metabolic on  admission:  Sodium 139, potassium 3.8, chloride 105, CO2 25, glucose 108,  BUN 19, creatinine 0.9, calcium 9.1, total protein 7, albumin 3.6, AST 18,  ALT 15, ALP 124, total bilirubin 0.4.   Urinalysis:  Specific gravity 1.019,  pH 5.5.  All chemistries were negative.   CT scan showed patchy hypodensity in the periventricular white matter  spaces, unchanged from the prior MRI scan.  MRI of the brain showed mild  atrophy, moderate changes of small vessel white matter disease.  No signs of  acute infarction, no intracranial enhancement.  MRA failed to show any  significant obstructive disease in the brain.   The patient is discharged with the following examination:  Temperature 97,  blood pressure 125/61, resting pulse 63, respirations 20, pulse oximetry  95%.  Her general examination is normal.  Mental status:  She is alert.  She  names objects and follows commands.  Her memory seems to be intact.  She has  no focal or lateralized neurological findings.  She is discharged in  improved condition.   DISCHARGE MEDICATIONS:  1. Micro-K 8 mEq twice daily.  2. Atenolol 75 mg  daily.  3. Synthroid 150 mcg daily.  4. Premarin 0.3 mg daily.  5. Hydrochlorothiazide 25 mg daily.  6. Tagamet 300 mg daily.  7. Paxil dose unknown.  8. Fiorinal as needed.   The patient has had a headache in the hospital this afternoon.  We treated  it with Fioricet and it gave her some relief.   She will see her primary physician after discharge.  She will see Dr. Lesia Sago who has seen her previously or Dr. Jacki Cones who admitted her to  the hospital this time as needed and has been given the phone number of  [facility].                                                Deanna Artis. Sharene Skeans, M.D.    [facility]  D:  05/07/2004  T:  05/09/2004  Job:  [id number]   cc:   Marlan Palau, M.D.  [address]  [address]  [place]  [place] [address]  Fax: [phone]

## 2011-04-05 NOTE — Consult Note (Signed)
NAME:  Lisa, Warner NO.:  [id number]   MEDICAL RECORD NO.:  [id number]          PATIENT TYPE:  INP   LOCATION:  5502                         FACILITY:  [facility]   PHYSICIAN:  Marlan Palau, M.D.  DATE OF BIRTH:  1930/02/27   DATE OF CONSULTATION:  03/18/2005  DATE OF DISCHARGE:                                   CONSULTATION   HISTORY OF PRESENT ILLNESS:  [name] is a 77 year old right-handed  white female born 09-19-1930 with a history of episodes of confusion,  chronic headaches. The patient was seen in June 2005, admitted by Dr.  Casimiro Needle L. Reynolds, and felt to have an episode of confusion related to  change in global amnesia. The patient, however, was talking about relatives  that were dead, etc. During the event, there was some question that she may  have had a toxic metabolic encephalopathy. A MRI scan showed chronic small  vessel disease. MR angiogram was unremarkable. EGG study showed no evidence  of seizure.   The patient had done fairly well but once again was evaluated in February of  2006 for confusion. The patient is brought in at this point for a bout of  colitis and does not remember the first day of admission. For this reason,  neurology is asked to see this patient once again. The patient has had a MRI  scan of the brain and MR angiogram once again in February of 2006 that  showed no acute changes. The patient had elevation of liver enzymes on  admission that had been slowly improving. The patient does complain of a  right occipital headache with radiation to the right retroorbital area.   PAST MEDICAL HISTORY:  1.  History of amnesia and confusion June of 2005 with some confusion in      February 2006.  2.  History of a right occipital headache.  3.  History of colitis on this admission.  4.  History of hypertension.  5.  Hypothyroidism.  6.  Mitral valve prolapse.  7.  Peptic ulcer disease.  8.  Hiatal hernia.  9.   Gallbladder surgery.  10. Hysterectomy.  11. Right total knee replacement.   ALLERGIES:  The patient has an allergy to PENICILLIN and CODEINE.   HABITS:  Does not smoke or drink.   CURRENT MEDICATIONS:  1.  Atenolol 75 mg daily.  2.  Premarin 0.3 mg daily.  3.  Hydrochlorothiazide 25 mg daily.  4.  Levothyroxine 0.15 mg daily.  5.  Protonix 40 mg daily.  6.  Paxil 20 mg daily.  7.  Potassium 20 mEq daily.  8.  Tylenol p.r.n.  9.  Zofran 4 mg p.r.n.   SOCIAL HISTORY:  This patient is divorced. Lives in the [place] area. The patient is retired. Has three children.   FAMILY HISTORY:  Notable that sister has asthma, some memory problems as  well. The patient's mother and father both have heart disease. Father had  some sort of pulmonary disease prior to his death.   REVIEW OF SYMPTOMS:  Notable for no recent fevers or chills. The patient had  some trouble mixing up words. Denies neck pain. Has some slight shortness of  breath. Cold-like symptoms prior to admission. Has had some vertigo on and  off, right occipital to right frontal temporal headache. Some nausea,  vomiting, diarrhea on this admission. Denies any black out episodes.   PHYSICAL EXAMINATION:  VITAL SIGNS:  Blood pressure is 128/59, heart rate  74, respiratory rate 18, temperature afebrile.  GENERAL:  This patient is a fairly well-developed white female who is alert  and cooperative at the time of examination.  HEENT:  Head is atraumatic. Eyes show pupils are equal, round, and reactive  to light. Disk are flat bilaterally.  NECK:  Supple. No carotid bruits.  LUNGS:  Respiratory examination is clear.  CARDIOVASCULAR:  A regular rate and rhythm. No obvious murmurs or rubs are  noted.  EXTREMITIES:  Without significant edema.  NEUROLOGICAL:  Cranial nerves as above. Facial symmetry is present. The  patient has good sensation to face with pinprick and soft touch bilaterally.  Has good strength to  facial muscles and to muscles with head turning and  shoulder shrug bilaterally. Speech is well enunciated and not aphasic. Motor  testing reveals 5/5 strength on all fours. Good symmetric motor tone is  noted throughout. Sensory testing is again intact to pinprick, soft touch,  and vibratory sensation throughout. The patient has good finger-nose-finger,  toe-to-finger bilaterally. The patient is not ambulated. Deep tendon  reflexes remained symmetric and normal. Toes are downgoing bilaterally. No  pronator drift is seen. The patient is oriented to person, place, date. The  patient is able to state the name of the president, has good serial 7's,  remembers one of three words at five minutes. The patient repeats well,  follows three step commands well.   LABORATORY DATA:  White count was 8.8, hemoglobin 13.7, hematocrit 40.4, MCV  90.2, platelets of 238,000. The patient appears to have a sodium of 137,  potassium 3.5, chloride of 102, CO2 28, glucose of 105, BUN 11, creatinine  0.8, total bilirubin 0.4, alkaline phosphate 154, SGOT 19, SGPT 14, total  protein 7.1, albumin of 3.6, calcium 8.8, lipase of 23. Troponin I is less  than 0.5. Urinalysis reveals a specific gravity of 1.07, pH of 6.5.  Otherwise negative.   IMPRESSION:  Transient episode of confusion:  Rule out transient global  amnesia versus toxic metabolic encephalopathy/delirium state. This patient  seems to be quite oriented and cooperative at this point. The patient has  had three episodes of confusion in the last year without acute changes seen  on MRI scan. We will once again pursue further workup. It is somewhat  unusual for true transglobal amnesia to continue on and off, although it can  be recurrent in less than 5% of cases. Need to rule out mild organic brain  syndrome associated with a mild toxic metabolic encephalopathy.   PLAN:  1.  MRI of the brain.  2.  MR angiogram.  3.  EGG study. 4.  Check blood work for  B12 folate, RPR, thyroid profile, pneumonia level      given the elevated liver enzymes. We will follow this patient during      hospitalization.      CKW/MEDQ  D:  03/18/2005  T:  03/19/2005  Job:  [id number]   cc:   Cassell Clement, M.D.  [address], [address]  [place]  [place] [address]  Fax:  [phone] 

## 2011-04-05 NOTE — Consult Note (Signed)
NAME:  [name], [name]                          ACCOUNT NO.:  [id number]   MEDICAL RECORD NO.:  [id number]                   PATIENT TYPE:  INP   LOCATION:  5524                                 FACILITY:  [facility]   PHYSICIAN:  Marlan Palau, M.D.               DATE OF BIRTH:  07/08/1930   DATE OF CONSULTATION:  08/24/2002  DATE OF DISCHARGE:                                   CONSULTATION   HISTORY OF PRESENT ILLNESS:  The patient is a 77 year old right-handed white  female born 10-06-2030, with a history of headaches that are long-standing  dating back to her teenage years, and in the past have been virtually daily  at times.  This patient has recently had some problems with headaches once  again on a daily basis taking Fiorinal and Darvocet for her headaches daily  in nature.  The patient has been seen by Dr. Orlin Hilding through our office in  the past for her headaches.  The patient has a strong family history for  headaches.  Headaches tend to be bifrontal in nature, may be associated with  nausea, but not vomiting.  The patient has had very similar headaches upon  this admission that are more severe then usual, but the headaches themselves  are identical to what she has experienced in the past.  The patient notes no  temporal tenderness, has had some weight loss following a total knee surgery  done in 5/03.  This patient has also been discovered to have a very elevated  sedimentation rate of 118, has had elevation of liver enzymes, and alkaline  phosphatase.  This patient was seen by Dr. Kellie Simmering, was placed on  prednisone, but headaches did not improve with this medication.  The patient  had been taken off prednisone, and has had elevation of blood sugars on  prednisone, and is currently being evaluated for possible new onset  diabetes.  Neurology was asked to see this patient for further evaluation.  The patient is on lipid lowering agents.   PAST MEDICAL HISTORY:  1. History of  long-standing headaches that have been extremely frequent     since teenage years.  The patient has had recent daily headaches.  2. Elevated sedimentation rate, alkaline phosphatase.  3. Status post right total knee replacement in 5/03.  4. Status post hysterectomy in the past.  5. History of gallbladder resection in the past.  6. History of tonsillectomy in the past.  7. History of mitral valve prolapse.  Cardiac catheterization done recently     was apparently negative.  8. History of hypothyroidism with TSH in the 12 range at this time.  9. History of appendectomy.  10.      History of hyperlipidemia.  11.      History of depression.  12.      Hypoalbuminemia.   CURRENT MEDICATIONS:  1. Premarin 0.3 mg q.d.  2.  Atenolol 25 mg b.i.d.  3. Paxil 20 mg q.d.  4. Zocor 80 mg q.d.  5. Potassium 40 mEq t.i.d.  6. Pyridium 100 mg b.i.d.  7. Synthroid 0.2 mg q.d.  8. Phenergan 12.5 mg q.d.  9. Sublingual nitroglycerin.  10.      Albuterol.  11.      Pepcid b.i.d. p.r.n.  12.      Fiorinal p.r.n.   ALLERGIES:  1. PENICILLIN.  2. CODEINE.   HABITS:  Does not smoke or drink.   SOCIAL HISTORY:  This patient lives in the [place] area, is divorced, has  three children all alive and well.  One of her daughters has had significant  problems with headaches.   FAMILY HISTORY:  Mother died with Parkinson's disease.  Father died with  lung disease.  Had a significant history of headaches.  The patient had one  sister that died with cancer.   REVIEW OF SYMPTOMS:  Notable for no fevers or chills prior to this  admission.  The patient denies any visual field changes, numbness or  weakness of the face, arms, or legs.  Denies any black out spells, gait  disorder.  Has occasional shortness of breath, chest pain associated with  mitral valve prolapse, but again cardiac catheterization has been negative.  The patient does have occasional chest pains.  Denies abdominal pain or  vomiting, has  had some nausea.   PHYSICAL EXAMINATION:  VITAL SIGNS:  Blood pressure is 108/52, heart rate  70, respiratory rate 18, temperature afebrile.  GENERAL:  This patient is a fairly well-developed white female who is alert  and cooperative at the time of examination.  HEENT:  Head is atraumatic.  Eyes:  Pupils equal, round, reactive to light.  Disks are flat bilaterally.  Excellent venous pulsations are seen.  NECK:  Supple, no carotid bruits noted.  No tenderness or enlargement of the  temporal arteries are noted on either side.  RESPIRATORY:  Clear.  CARDIOVASCULAR:  Regular rate and rhythm with no obvious murmurs or rubs  noted.  EXTREMITIES:  Without significant edema.  NEUROLOGIC:  Cranial nerves as above.  Facial asymmetry is present.  The  patient has good sensation of face to pinprick and soft touch bilaterally.  Has good __________ facial muscles and the muscles turning shoulders  bilaterally.  Speech is well annunciated, not aphasic, again visual fields  are full.  Motor testing with 5/5 strength of all fours, good symmetric  motor tone __________ .  Sensory testing is intact to pinprick, soft touch,  vibration throughout.  The patient has good finger-to-nose, finger-to-toe,  and finger-to-finger bilaterally.  The patient is not ambulated.  Deep  tendon reflexes remain symmetric, normal toes, neutral bilaterally.  No  drift is seen.   LABORATORY DATA:  Notable for a white count of 9.9, hemoglobin of 11.0,  hematocrit of 32.6, and the patient has platelets of 302.  Sedimentation  rate 118.  INR 1.0.  Sodium 135, potassium 4.9, chloride 103, CO2 27,  glucose 180, initially was 100 on admission, BUN of 9, creatinine 0.8,  calcium 8.8.  Total protein 7.6, but albumin level is 2.5, AST 48, ALT 40,  alkaline phosphatase 590, total bilirubin 0.3.  TSH 12.08.  Urinalysis shows positive nitrites, antimitochondrial antibodies are pending, ANA is pending,  C-reactive protein pending.   Repeat sedimentation rate and hemoglobin A1C  are pending.  Five prime nucleotidase is pending.   CT of the head shows two small areas  of white matter ischemic changes of the  right parietal area, otherwise unremarkable.  CT scan of the abdomen shows  some biliary ductal dilation, otherwise unremarkable.  Bladder wall  thickening was seen.  No other remarkable findings were noted.   IMPRESSION:  1. History of long-standing history of headaches with recurrent headache     during this admission.  2. Elevated sedimentation rate, alkaline phosphatase, liver enzyme levels,     etiology unclear.  3. History of hyperlipidemia, on cholesterol lowering agents.   The patient's headache that she describes at this time is identical to what  she has had previously.  The patient has no temporal artery tenderness, no  history of jaw claudication, and has no enlargement of the temporal arteries  by examination.  I would agree with Dr. Kellie Simmering, that the likelihood of  tempo arteritis is probably very low given the lack of responsiveness to  steroids.  The patient likely has migraine and some underlying tension type  headache, and also is at risk for rebound type headaches, given daily use of  medications.  At this point, I will pursue a further workup to fully exclude  temporal arteritis.   PLAN:  1. Carotid Doppler study to look at the external carotid arteries to look     for a halo sign consistent with temporal arteritis.  2. Consider DAG45 protocol plus Reglan.  3. Check serum protein electrophoresis, ANA, rheumatoid factor, hepatitis     screen, would consider discontinuance of the Zocor for now.  Check muscle     enzyme level.  4. We will follow the patient's clinical course while in house.                                               Marlan Palau, M.D.    CKW/MEDQ  D:  08/24/2002  T:  08/26/2002  Job:  [id number]   cc:   Cassell Clement, MD   [facility]  [address]

## 2011-04-05 NOTE — Cardiovascular Report (Signed)
[place]  Patient:    [name], [name]                       MRN: [id number] Proc. Date: 02/13/00 Adm. Date:  [id number] Disc. Date: [id number] Attending:  Rudean Hitt CC:         [name], M.D.             Cardiac Catheterization Lab                        Cardiac Catheterization  HISTORY:  Ms. Lapp is a 77 year old female who has a long history of chest pain.  She had a negative Cardiolite study last year.  She is referred for heart catheterization because of persistent chest pain.  DESCRIPTION OF PROCEDURE:  The patient is allergic to all novocaine-like medicines.  She was given 3.5 mg of Versed as well as Fentanyl 50 mcg.  Her groin was injected with normal saline for its local deadening effect.  The right femoral artery was easily cannulated without any ill effects.  The patient seemed to tolerate the cannulation without problems.  HEMODYNAMICS:  The left ventricular end diastolic pressure was 15.  There was no aortic valve gradient.  ANGIOGRAPHY: 1. The left main coronary artery is smooth and normal. 2. The left anterior descending artery is smooth and normal. 3. The diagonal vessels are normal. 4. The left circumflex artery is very large and dominant.  It gives off    several large obtuse marginal branches which are essentially normal.  The    circumflex then gives off several large posterolateral branches and a    normal posterior descending artery. 5. The right coronary artery is small and nondominant.  It is smooth and    normal.  The left ventriculogram was performed in a 30-degree RAO position.  It reveals overall normal left ventricular systolic function with no segmental wall motion abnormalities.  There was some catheter-induced mitral regurgitation. On the post PVC beat, there was no mitral regurgitation.  COMPLICATIONS:  None.  CONCLUSIONS: 1. Smooth and normal coronary arteries. 2. Normal left  ventricular systolic function. DD:  02/14/00 TD:  02/15/00 Job: [id number]

## 2011-04-05 NOTE — H&P (Signed)
[name], [name]                       ACCOUNT NO.:  [id number]   MEDICAL RECORD NO.:  [id number]                   PATIENT TYPE:  INP   LOCATION:  5524                                 FACILITY:  [facility]   PHYSICIAN:  Michael L. Thad Ranger, M.D.           DATE OF BIRTH:  [birth year]   DATE OF ADMISSION:  05/06/2004  DATE OF DISCHARGE:                                HISTORY & PHYSICAL   CHIEF COMPLAINT:  Mental status changes.   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 Anne Hahn in the past.  The patient  reports that she was feeling fairly well this morning and then at some point  in the afternoon had a sensation of being crazy in the head.  She really  does not recall subsequent events.  The patient's daughter, who is a CP tech  here at [facility] hospital, called her this evening and noted that she was  talking about spending time with an aunt whom she knew would have normally  seen and then went to the patient's house and found her not recognizing her  own house, thinking she had been at work today when she had not worked in  years and asking about deceased relatives.  There are no other specific  neurologic symptoms.  In particular, no evidence of any facial droop,  weakness, numbness, slurring speech.  Patient's daughter reports that en  route to the hospital she was asking questions over and over again and did  not seem to be processing short-term memory.  There has been no evident loss  of consciousness and no  evidence of a fall or injury.  In the hospital she  continues to complain of being crazy in the head as well as a dull  headache.   PAST MEDICAL HISTORY:  She has been seen by Dr. Anne Hahn, as above.  According  to notes, as far as I can tell, it is primarily for a chronic daily  headache, and she has been on Topamax and  nortriptyline in the past.  She  does have a mitral valve prolapse with a normal cardiac cath in 01/2000.  Also hyperlipidemia and hypothyroidism.   FAMILY HISTORY:  Remarkable for headaches and Parkinson's disease in her  father.   SOCIAL HISTORY:  She lives alone and is normally independent in her  activities of daily living.  She does not use tobacco or alcohol.   ALLERGIES:  PENICILLIN and CODEINE.   MEDICATIONS:  1. Potassium 16 mEq daily.  2. Atenolol 75 mg q.d.  3. Levothyroxine 0.15 mg q.d.  4. Premarin 0.3 mg q.d.  5. HCTZ 25 mg q.d.  6. Paxil, uncertain dose q.d.  7. Midrin p.r.n.   REVIEW OF SYSTEMS:  Remarkable for a dull headache, as above, as well as  occasional shortness of breath which is not over and above her normal.  Otherwise negative across 10 systems.   PHYSICAL EXAMINATION:  VITAL SIGNS:  Temperature 98.0.  Blood pressure  123/66.  Pulse 61.  Respirations 20.  GENERAL:  She is alert and in no acute distress.  HEAD:  Cranium is normocephalic and atraumatic.  Oropharynx is benign.  NECK:  Supple without carotid bruit.  CHEST:  Clear to auscultation.  HEART:  Regular rate and rhythm without murmur.  NEUROLOGIC:  Mental status:  She is awake and alert.  She is oriented to a  hospital and June of 2005.  She registers three free memory objects but does  not recall any of them after distraction or even that she recalled three  objects.  She has no defect to confrontational naming and can repeat a  phrase and perform a three-step command.  Long-term memory seems adequate.  Mood is euthymic and affect appropriate.  Cranial nerves, funduscopic exam  benign.  Pupils equal and briskly reactive.  Extraocular movements are full  without nystagmus.  Visual fields are full to confrontation.  Face, tongue  and palate move normally and symmetrically.  Shoulder shrug strength is  normal.  Motor:  Normal bulk and tone.  Normal strength in all tested  extremity muscles.   Sensation intact to light touch and pinprick in all  extremities.  Coordination and rapid movements are performed well.  Finger-  nose is performed well.  Gait:  She arises from chair easily, and her stance  is normal.  Stable to walk about without much difficulty.  Reflexes 2+ and  symmetric.  Toes are downgoing.   LABORATORY REVIEW:  CBC:  White count 12.0, hemoglobin 13.0, platelets  244,000.  CMET is pending.  CT of the head is personally reviewed.  It is  read out as normal, although I note a hypolucency in the high right  frontoparietal area.  There is an old vascular disease in this area on the  MRI.  I question whether this is an acute findings or not.   IMPRESSION:  Mental status changes.  Findings are most typical of transient  global amnesia.  However, one certainly cannot exclude seizure, stroke or  psychogenic process at this point.   PLAN:  We will admit to further observe.  We will check MRI and possibly  EEG.  We will follow her mental status while in-house.                                                Michael L. Thad Ranger, M.D.    MLR/MEDQ  D:  05/06/2004  T:  05/07/2004  Job:  [id number]

## 2011-04-05 NOTE — Op Note (Signed)
[place]  Patient:    KHAMIA, STAMBAUGH [facility] Visit Number: [id number] MRN: [id number]          Service Type: SUR Location: 5000 [id number] [id number] Attending Physician:  Randolm Idol Dictated by:   [name], M.D. Proc. Date: 03/30/02 Admit Date:  03/30/2002                             Operative Report  PREOPERATIVE DIAGNOSIS:  Osteoarthritis, right knee.  POSTOPERATIVE DIAGNOSIS:  Osteoarthritis, right knee.  OPERATION PERFORMED:  Right total knee replacement.  SURGEON:  [name], M.D.  ASSISTANT:  Arnoldo Morale, P.A.  ANESTHESIA:  General orotracheal.  COMPLICATIONS:  None.  COMPONENTS:  Depuy LCS complete standard femoral component, metal backed rotating cruciate designed patella.  A keeled #3 tibial tray with a 12.5 mm bridging bearing.  All components were secured with polymethyl methacrylate.  DESCRIPTION OF PROCEDURE:  With the patient comfortable on the operating room and under general orotracheal anesthesia, a thigh tourniquet was applied to the right lower extremity.  The nursing staff then inserted a Foley catheter.  The right lower extremity was prepped with Betadine scrub and DuraPrep from the tourniquet to the midfoot.  Sterile draping was performed.  With the extremity still elevated, it was Esmarch exsanguinated with the proximal tourniquet at 350 mHg.  A midline longitudinal incision was made centered over the patella extending to the superior pouch of the tibial tubercle.  By sharp dissection, the incision was carried down to subcutaneous tissues and abundant adipose tissue through the first layer of capsule.  A medial parapatellar incision was then made with a Bovie.  There was a very minimal clear yellow joint effusion.  The patella was everted to 180 degrees, the knee flexed to 90 degrees.  There was a mild to moderate amount of synovitis.  A synovectomy was performed. Medial release was also  performed because of the varus position of the knee preoperatively.  We had templated either standard or standard plus femoral component and a #3 tibial component.  The standard femoral component was confirmed intraoperatively as well as the #3 tibial tray.  The appropriate femoral and tibial jigs were applied to obtain the appropriate femoral and tibial bone cuts.  ACL and PCL were sacrificed.  MCL and LCL remained intact. A 12.5 mm flexion and extension gap were perfectly symmetrical.  I used a 4 degree distal femoral valgus cut.  Lamina spreaders were then inserted in the medial and lateral compartments to debride remnants of medial and lateral menisci and remnants of ACL and PCL. Small osteophytes were removed from the posterior aspect of the femoral condyles.  There were osteophytes along the medial tibial plateau that were also removed with a rongeur.  Retractor was then placed behind the tibia and along the lateral tibial plateau to obtain the tibial cuts.  The trial femoral and tibial components were then inserted with a 12.5 mm bridging bearing.  He had slight hyperextension, excellent flexion without any malrotation or malmotion of any of the components.  There was no opening with varus or valgus stress.  The patella was prepared by removing approximately 10 mm of bone, leaving about 12 to 13 mm of patella thickness.  A cruciate designed jig was applied.  The trial patella was then applied, placed through a full range of motion without subluxation.  The trial components were removed.  The joint was then  copiously irrigated  with jet saline and antibiotic solution.  Each of the final components was then secured with polymethyl methacrylate.  Extraneous methacrylate was removed with a Glorious Peach.  The knee was placed in extension during maturation of the methacrylate and the patella clamp was applied.  The tourniquet was then deflated.  Gross bleeders were Bovie coagulated.   A Hemovac was not necessary.  Deep capsule was closed with interrupted #1 Ethibond.  Superficial capsule was closed with a running 0 Vicryl, subcutaneous with 2-0 Vicryl, skin closed with skin clips.  Sterile bulky dressing was applied followed by a support stocking and knee immobilizer.  The patient is allergic to Marcaine and Xylocaine and accordingly, a block was not performed. Dictated by:   [name], M.D. Attending Physician:  Randolm Idol DD:  03/30/02 TD:  03/31/02 Job: [id number]

## 2011-04-05 NOTE — Procedures (Signed)
CLINICAL HISTORY:  The patient is a 77 year old who has had an episode of  problems with memory and some disorientation. Study is being done to look  for the presence of seizure disorder. The patient is thought to have  transient global amnesia.   PROCEDURE:  The tracing was carried out on a 32-channel digital Cadwell  recorder reformatted into 16-channel montages with 1 devoted to EKG. The  patient was awake and alert during the recording. The International 10/20  system lead placement was used.   MEDICATIONS:  1. K-Dur.  2. Synthroid.  3. Premarin.  4. Ambien.  5. Vicodin.  6. Tylenol.   DESCRIPTION OF FINDINGS:  Dominant frequency is a 7 to 8 hertz rhythmic 20  microvolt activity that is well regulated and attenuates partially with eye  opening. Background activity is a mixture of rhythmic upper theta/lower  alpha range activity ___________voltages with frontally predominant under 10  microvolt beta range activity. The background is continuous. There is no  focal slowing in it.   Intermittent photo stimulation induced a partial driving response at 7 and 9  hertz. Hyperventilation caused little change in background other than to  increase muscle artifact. EKG showed an interventricular conduction delay  with sinus rhythm and a ventricular response of 69 beats per minute.   IMPRESSION:  Borderline EEG on the basis of dominant frequencies that is in  the extreme lower limits of normal and an otherwise well organized  background. No evidence of seizure activity was seen. This may represent a  drowsy state. Alternatively toxic delirium or a mild primary neural  degenerative disorder. The findings require careful clinical correlation.    WILLIAM H. Sharene Skeans, M.D.   OZD:GUYQ  D:  05/07/2004 09:32:06  T:  05/07/2004 12:21:36  Job #:  [id number]

## 2011-04-05 NOTE — H&P (Signed)
[name], [name]                ACCOUNT NO.:  [id number]   MEDICAL RECORD NO.:  [id number]          PATIENT TYPE:  OBV   LOCATION:  2013                         FACILITY:  [facility]   PHYSICIAN:  Lonia Blood, M.D.DATE OF BIRTH:  May 24, 1930   DATE OF ADMISSION:  11/03/2005  DATE OF DISCHARGE:                                HISTORY & PHYSICAL   CHIEF COMPLAINT:  Tachycardia.   HISTORY OF PRESENT ILLNESS:  [name] is a 77 year old female  with a pertinent medical history of mitral valve prolapse.  She presented to  the hospital today because of complaints of palpitations.  She reported that  they began some time this afternoon.  She has been able to notice them and  they have been troublesome.  She otherwise says that she has no chest pain  whatsoever.  There has been no shortness of breath and no dyspnea on  exertion.  There has been no nausea.  There has been no vomiting.  There  have been no flu symptoms and no complaints of abdominal discomfort.  There  has been no change in bowel or bladder habits.  Patient does have a history  of mitral valve prolapse as mentioned above and normally takes Atenolol 50  mg in the morning and 25 mg p.o. q.h.s.  She took her dose last night, but  admits that she did not take her dose this morning.  She reports no recent  acute stress and no recent acute illness.   REVIEW OF SYSTEMS:  Comprehensive review of systems is unremarkable with  exception of positive elements in history of present illness noted above.   PAST MEDICAL HISTORY:  1.  Status post cholecystectomy.  2.  Status post hysterectomy.  3.  Status post right total knee replacement.  4.  Hypertension.  5.  Hyperlipidemia.  6.  Chronic headache.  7.  Mitral valve prolapse.  8.  Osteoarthritis.  9.  Cardiac catheterization in 2001 which was normal.  10. Hypothyroidism previously on Synthroid therapy.  11. Transient global amnesia requiring hospitalization.  12.  Status post tonsillectomy.  13. Status post appendectomy.   MEDICATIONS:  1.  Phenobarbital 32.4 mg p.o. q.h.s. p.r.n.  2.  Neurontin 600 mg p.o. q.h.s.  3.  Aricept 10 mg p.o. daily.  4.  Cipro 500 mg p.o. b.i.d. p.r.n.  5.  Micro K 8 mEq daily.  6.  Paxil 200 mg p.o. daily.  7.  Darvocet p.r.n.  8.  Atenolol 50 mg one in the morning and one-half q.h.s.   ALLERGIES:  PENICILLIN and CODEINE.   FAMILY HISTORY:  Noncontributory secondary to age.   SOCIAL HISTORY:  Patient is divorced.  She has three healthy children.  She  does not smoke.  She does not drink alcohol to excess.   DATA REVIEWED:  Electrolytes are balanced.  BUN is 11, creatinine is not  obtained.  pH is 7.44, pCO2 is 43, pO2 is not obtained.  LFTs are normal.  Lipase is normal.  D-dimer is normal.  BNP is normal.  Chest x-ray reveals  no  acute disease with cardiomegaly.  CBC is normal.  12-lead EKG reveals  sinus rhythm at 121 beats per minute, but significant Q-waves in the  inferior leads.  Comparison to old EKGs suggest that these Q-waves are new  since April 2006 and are significantly enlarged when compared to November  2006.   PHYSICAL EXAMINATION:  VITAL SIGNS:  Temperature 97.4, blood pressure  111/57, heart rate 110-137, respiratory rate 20, O2 saturation 92% on room  air.  GENERAL:  Well-developed, well-nourished female in no acute respiratory  distress.  LUNGS:  Clear to auscultation bilaterally without wheezes, rhonchi.  CARDIOVASCULAR:  Regular rhythm with tachycardia without gallop or rub.  ABDOMEN:  Nontender, nondistended.  Soft.  Bowel sounds present.  No  hepatosplenomegaly.  No bruit.  No ascites.  EXTREMITIES:  No significant clubbing, cyanosis, edema bilateral lower  extremities.  NEUROLOGIC:  Cranial nerves II-XII intact bilaterally.  5/5 strength  bilateral upper and lower extremities.  Alert and oriented x4.   IMPRESSION AND PLAN:  1.  Tachycardia.  Most likely the patient's sinus  tachycardia simply      represents her failure to take her atenolol this morning in the setting      of mitral valve prolapse.  Nonetheless, she does have deep significant Q-      waves in the inferior leads which were not present in April of this year      and were not of significant size in November of this year.  This      concerns me that patient's tachycardia could be causing significant      stress on the patient's heart.  It is also possible that these Q-waves      are only transient changes because of the tachycardia.  I will admit the      patient.  She will be ruled out with serial enzymes for myocardial      infarction and we will recheck a 12-lead EKG in the morning.  It is my      hope that with administration of the patient's beta blockers her EKG      will normalize and she will be stable for outpatient cardiovascular      evaluation.  2.  Hypertension.  Currently well controlled.  3.  Hyperlipidemia.  The patient is currently on no medical therapy.      Fasting lipid panel will be checked.      Lonia Blood, M.D.  Electronically Signed     JTM/MEDQ  D:  11/03/2005  T:  11/04/2005  Job:  [id number]   cc:   Cassell Clement, M.D.  Fax: [phone]

## 2011-04-05 NOTE — Discharge Summary (Signed)
[place]  Patient:    [name], [name] [facility] Visit Number: [id number] MRN: [id number]          Service Type: [facility] Location: 5621 [id number] [id number] Attending Physician:  Herold Harms Dictated by:   Mcarthur Rossetti. Angiulli, P.A. Admit Date:  04/06/2002 Discharge Date: 04/10/2002   CC:         [name]. [name], M.D.  [name], M.D.   Discharge Summary  DISCHARGE DIAGNOSES 1.  Right total knee arthroplasty secondary to degenerative joint disease on     Mar 30, 2002. 2.  Anemia. 3.  Hypothyroidism. 4.  Escherichia coli urinary tract infection. 5.  Gastroesophageal reflux disease. 6.  Mitral valve prolapse with angina. 7.  Hyperlipidemia. 8.  History of migraine headaches. 9.  Depression. 10. History of right total knee arthroplasty in 1985.  HISTORY OF PRESENT ILLNESS: The patient is a 77 year old white female with a history of right total knee replacement in January 1985 who was admitted on Mar 30, 2002, with progressive right knee pain. X-rays with end-stage osteoarthritis. She underwent a right total knee arthroplasty on Mar 30, 2002, per Dr. Cleophas Warner. She was placed on Coumadin for deep venous thrombosis prophylaxis and weightbearing as tolerated. Postoperative pain management. Her PCA was discontinued on Apr 01, 2002. There was no chest pain or shortness of breath. She was close supervision for ambulation, minimal assistance for activities of daily living. Latest INR of 2.2, hemoglobin of 10, chemistries unremarkable. Admitted for compression rehabilitation program.  PAST MEDICAL HISTORY: See discharge diagnoses.  PAST SURGICAL HISTORY 1. Tonsillectomy. 2. Appendectomy. 3. Hysterectomy.  SOCIAL HISTORY: No alcohol or tobacco. Lives alone in [place]. Independent prior to admission. Retired. A one-level home, twos steps to entry. Local family. Works.  ALLERGIES: Penicillin and codeine.  PRIMARY PHYSICIAN: Dr.  Patty Sermons.  MEDICATIONS PRIOR TO ADMISSION 1. Potassium supplement 8 mEq b.i.d. 2. Tenormin. 3. Synthroid. 4. Hydrochlorothiazide. 5. Tagamet. 6. Fiorcet. 7. Paxil. 8. Lipitor. 9. Nitroglycerin p.r.n.  HOSPITAL COURSE: The patient did well on the rehabilitation services with therapies initiated on a twice a day basis.  The following issues were followed during the patients rehabilitation course.  Pertaining to the patients right total knee arthroplasty she remained stable. The surgical site was healing nicely. The staples had been removed with no signs of infection. She was weightbearing as tolerated, requiring only supervision for her ambulation. Home health therapies had been arranged. She continued on Coumadin for deep venous thrombosis prophylaxis. Venous Doppler studies prior to her discharge were negative. She would complete Coumadin protocol followed by [facility] services.  Postoperative anemia with latest hemoglobin of 10.8, hematocrit 31.6. There were no bleeding episodes. She will continue on her hormone supplement for hypothyroidism. She had no chest pain or shortness of breath with noted history of mitral valve prolapse and angina which had been followed by Dr. Patty Sermons and she used nitroglycerin as needed. She was maintained on her Lipitor for hyperlipidemia as well as her Fioricet for her history of migraine headaches. She will continue on her home regimen of Paxil for a history of depression. She had no bowel disturbances. She was treated with for an E. coli urinary tract infection with Cipro initiated on Apr 08, 2002. She had no dysuria or hematuria. Overall for her functional mobility she was ambulating extended distances with a walker and supervision. She was supervision for activities of daily living, although she did need some minimal assistance to put on her slippers.  Home health therapies will again continue to follow.  Latest labs showed an INR  of 2.9 which was monitored closely while on Cipro for her urinary tract infection. Latest hemoglobin 10.8, hematocrit 31.6, sodium 138, potassium 3.6, BUN 12, creatinine 0.8.  DISCHARGE MEDICATIONS 1.  Coumadin q.d. with doses to be established at the time of discharge.     Coumadin to be completed on April 30, 2002. 2.  Synthroid 150 mcg q.d. 3.  Hydrochlorothiazide 25 mg q.d. 4.  Paxil 20 mg q.d. 5.  Zocor 40 mg q.d. 6.  Pepcid 20 mg q.d. 7.  Potassium chloride 8 mEq t.i.d. 8.  Tenormin 50 mg q.d. 9.  Tenormin 25 mg q.h.s. as well as the 50 mg q.a.m. 10. Cipro 250 mg b.i.d. x 5 more days after discharge. 11. Tylox p.r.n. pain.  DISCHARGE INSTRUCTIONS: Activity was weightbearing as tolerated with walker with total knee precautions. Diet was regular. Special instructions, cleanse knee incision daily with warm soap and water. No driving. Home health physical therapy. Home health nurse by [facility] services to complete Coumadin protocol.  FOLLOW UP: She should follow up with Dr. Cleophas Warner, call for an appointment. Dr. Patty Sermons medical management. Dictated by:   Mcarthur Rossetti. Angiulli, P.A. Attending Physician:  Herold Harms DD:  04/09/02 TD:  04/12/02 Job: [id number]

## 2011-04-05 NOTE — H&P (Signed)
[place]  Patient:    [name], [name] [facility] Visit Number: [id number] MRN: [id number]          Service Type: Attending:  [name], M.D. Dictated by:   Arnoldo Morale, P.A. Adm. Date:  03/30/02                           History and Physical  DATE OF BIRTH:  25-Apr-1930.  CHIEF COMPLAINT:  Progressively worsening right knee pain for the last 20 years.  HISTORY OF PRESENT ILLNESS:  This 77 year old white female patient presented to Dr. Cleophas Dunker with a history of approximately 20 years of intermittent right knee pain.  She does have a history of a right knee arthroscopy by Dr. Tresa Res in January of 1985 and a subsequent right tibia fracture which was treated with casting some years later.  She has had no other injury to the knee.  The pain was gradual in onset but has been getting progressively worse.  At this point the pain is described as a constant aching sensation over the anterior joint line of the knee and also posteriorly with radiation proximally into her buttock and distally into her ankle.  The pain increases with walking and decreases with rest or the use of heat or ice.  She is also taking aleve which provides a moderate amount of relief.  She denies any popping, catching, grinding, locking, or giving way of the knee but she does wear a pullover knee elastic when she is out and about or a knee immobilizer when she is walking around at home.  She does have some swelling in the knee and occasional night pain but the night pain is treated at this time with an anti-inflammatory. She is currently not ambulating with any assistive devices.  ALLERGIES: 1. PENICILLIN, causes hives. 2. CODEINE, causes palpitations.  CURRENT MEDICATIONS: 1. Micro-K 8 mEq 1 tablet p.o. b.i.d. 2. Atenolol 50 mg 1 tablet p.o. q.a.m. and 1/2 a tablet p.o. q.p.m. 3. Synthroid 150 mcg 1 tablet p.o. q. day. 4. Hydrochlorothiazide 25 mg 1 tablet p.o.  q. day. 5. Tagamet 300 mg 1 tablet p.o. q. day p.r.n. 6. Fiorinal 1 to 2 p.o. q.4h. p.r.n. pain. 7. Paxil 20 mg 1 tablet p.o. q. day. 8. Lipitor 20 mg 1 tablet p.o. q. day. 9. Nitroglycerin 0.4 mg sublingually p.r.n. chest pain.  PAST MEDICAL HISTORY: 1. She has had hypothyroidism for about 15 to 20 years. 2. She was diagnosed with reflux and a hiatal hernia in about 1955. 3. She does have a history of a peptic ulcer disease but that is in the very    remote past. 4. She does have mitral valve prolapse which was diagnosed in 1979. 5. She does antibiotics before any dental work. 6. She also has a history of some angina but none in the last 2-3 months. 7. She denies any history of diabetes mellitus, hypertension, asthma or any    other chronic medical condition other than noted previously.  PAST SURGICAL HISTORY: 1. Tonsillectomy in 1970 by Dr. Gladstone Pih. 2. Hysterectomy by Dr. Lamar Laundry in 1966. 3. Open cholecystectomy by Dr. Rolene Course. 4. Open reduction, internal fixation of the right humerus fracture by Dr.    Claude Manges. [place], November 29, 1995. 5. Screw removal from right proximal humerus fracture by Dr. Claude Manges.    [place], January 31, 1996. 6. Right knee arthroscopy December 08, 1983 by Dr. Louanne Skye  L[place]ender.  SOCIAL HISTORY:  She denies any history of cigarette smoking, alcohol use, or drug use.  She is divorced and has 3 children.  She currently lives by herself in a 1 story house with 2 steps into the main entrance.  She is retired from the [employer], Education officer, community.  MEDICAL DOCTOR:  Her medical doctor is Dr. Cassell Clement and he has cleared her for surgery.  Phone number [phone].  FAMILY HISTORY:  Her mother died at the age of 72 with coronary artery disease and Parkinsons disease.  Her father died at the age of 26 with lung cancer and COPD.  She has 1 sister alive at age 40 with breast cancer and allergies. Her children are age 40, 1, and 17,  and she has 2 sons and 1 daughter being the youngest.  They are all alive and healthy.  REVIEW OF SYSTEMS:  She does have a history of pneumonia which was treated successfully in January of this year.  She has had migraines diagnosed about 8 years ago which she treats with Fiorinal.  She does have frequent headaches but does not use the Fiorinal every time.  She does complain of some chronic sinus congestion and seasonal allergies.  She does have a history of chest pain, irregular heart beat and palpitations in the past and that is usually treated with the nitroglycerin.  She also says she knows her activity limits and is able to control when she has those problems and so she has not had problems with her heart in the last several months.  She does have dyspnea on exertion with about 4 to 5 steps.  She complains of some urinary frequency over the last several weeks.  She does wear glasses but no hearing aid or dentures.  She does not have a living will or power of attorney.  All other systems are negative and noncontributory at this time.  PHYSICAL EXAMINATION:  GENERAL:  Well-developed well-nourished overweight white female in no acute distress.  Walks without a limp.  Mod and affect are appropriate.  Talks easily with the examiner.  Height 5 feet 4 inches.  Weight 170 pounds.  BMI is 28.  VITAL SIGNS:  Temperature 98.6 degrees Fahrenheit.  Pulse 64, respirations 16, and blood pressure 132/72.  HEENT:  Normocephalic and atraumatic without frontal or maxillary sinus tenderness to palpation.  Conjunctivae pink.  Sclerae anicteric.  PERRLA. EOMs intact.  Funduscopic examination shows physical red reflux bilaterally with normal retinal vasculature, optic disk, and cup.  No visible external ear deformities.  Hearing grossly intact.  Tympanic membranes, pearly, gray, bilaterally with good light reflex.  Nose:  Nasal septum midline.  Nasal mucosa pink and moist without exudates or polyps  noted.  Buccal mucosa pink and moist.  Dentition is in good repair but she does have several fillings.  Pharynx without erythema or exudates.  Tongue and uvula midline.  Tongue with fasciculations and uvula rises equally with phonation.  NECK:  No visible masses or lesions noted.  Trachea midline.  No palpable lymphadenopathy nor thyromegaly.  Carotids +2 bilaterally without bruits. Full range of motion and nontender to palpation along the cervical spine.  CARDIOVASCULAR:  Heart rate and rhythm regular.  S1, S2 present without rubs, clicks or murmurs noted at this time.  RESPIRATORY:  Respirations even and unlabored.  Breath sounds clear to auscultation bilaterally without rales or wheezes noted.  ABDOMEN:  She has a large rounded abdominal contour.  Well-healed right  upper quadrant incision line and faded lower suprapubic incision line.  No other deformities noted.  Bowel sounds present x 4 quadrants.  Soft, nontender to palpation without hepatosplenomegaly nor CVA tenderness.  Femoral pulses +2 bilaterally.  Nontender to palpation along the entire length of the vertebral column.  BREASTS/GU/RECTAL/PELVIC:  These examinations were deferred at this time.  MUSCULOSKELETAL:  She has a well-healed right anterior shoulder incision line.  No other deformities noted of her upper extremities and she has full range of motion of these extremities without pain.  Radial pulses are +2 bilaterally. She has full range of motion of her hips, ankles, and toes bilaterally.  DP and PT pulses are +2.  She has no lower extremity edema at this time.  Flexion of the hips are even at about 110 degrees.  The left knee has no obvious deformity.  She has full extension and flexion to 120 degrees.  She does have pain with palpation over both the medial and lateral joint line but lateral joint line appears a little more painful than medial.  There is no effusion. No real crepitus with range of motion of the  knee and her knee is stable to varus and valgus stress.  The right knee has no obvious deformity.  She has full extension but flexion only to 115 degrees.  There is crepitus with range of motion.  She is acutely tender to palpation on the medial and lateral joint line, medial more painful than the lateral.  She also opens medially with a valgus stress.  There is no effusion in the knee but +1 crepitus.  NEUROLOGIC:  Alert and oriented x 3.  Cranial nerves II-XII are grossly intact.  Strength 5/5 bilateral upper and lower extremities. Rapid alternating movements intact.  Deep tendon reflexes 2+ bilateral upper and lower extremities.  Normal muscle tone and bulk.  RADIOLOGIC FINDINGS:  X-rays taken of her right knee in April 2003, showed bone-on-bone contact in the medial compartment of the right knee with about 3 to 4 degrees of a varus position and some patellofemoral arthritis.  IMPRESSION: 1. End-stage osteoarthritis right knee. 2. Hypothyroidism. 3. Gastroesophageal reflux disease. 4. Hiatal hernia. 5. History of peptic ulcer disease. 6. Mitral valve prolapse with a history of angina.  PLAN: The patient will be admitted to [facility] on Mar 30, 2002 where she will undergo a right total knee arthroplasty by Dr. Claude Manges. [place].  She will undergo all the routine preoperative laboratory tests and studies prior to this procedure.  We will consult Dr. Patty Sermons in the hospital for medical management. Dictated by:   Arnoldo Morale, P.A. Attending:  [name], M.D. DD:  03/24/02 TD:  03/25/02 Job: [id number]

## 2011-04-05 NOTE — H&P (Signed)
[place]  Patient:    [name], [name]                     MRN: [id number] Adm. Date:  02/13/00 Attending:  Maisie Fus A. Patty Sermons, M.D. CC:         Hal T. Stoneking, M.D.             Alvia Grove., M.D.             [name], M.D.                         History and Physical  HISTORY:  This is a 77 year old Caucasian female, admitted with chest pain. She has a past history of suspected mitral valve prolapse and a past history of palpitations and atypical chest pain.  She was hospitalized in October, 2000, for chest pain and had negative enzymes nd ruled out for an MI.  She had a subsequent outpatient Adenosine Cardiolite Stress Test, which was normal; it showed no evidence of ischemia, and showed an ejection fraction of 74% and no wall motion abnormalities.  MEDICATIONS:  The patient has been on the following:  HCTZ 50 mg 1/2-tablet q.d., Premarin 0.3 mg daily, Synthroid 0.150 mg daily, Micro-K one twice a day, Nitrostat 1/150 sublingually p.r.n.. Riopan p.r.n., atenolol 50 mg in the a.m. and 25 mg t supper, Ventolin inhaler two puffs p.r.n., Tagamet 300 mg one or two p.r.n., Advil p.r.n., Tylenol Extra-Strength p.r.n., Paxil 20 mg one daily from Dr. Pete Glatter, Darvocet-N 100 q.6h. p.r.n. for headache and Lipitor 20 mg daily.  SOCIAL HISTORY:  She is a nonsmoker.  She does not drink any alcohol.  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  PAST MEDICAL HISTORY:  She is status post a cholecystectomy 20 years ago.  She as a history of hypercholesterolemia, on Lipitor.  History of hypothyroidism, on Synthroid.  She is also on estrogen replacement therapy for menopausal symptoms. She has a past history of edema for which she has been on HCTZ.  FAMILY HISTORY:  Positive for coronary artery disease.  REVIEW OF SYSTEMS:  Reveals she is not having any change in bowel  habits, melena or hematochezia.  Respiratory: No cough or sputum production.  Genitourinary: No recent dysuria but was having frequency and we called her in some Bactrim on 02/08/00.  ALLERGIES:  She is allergic to NOVOCAINE, COCAINE and PENICILLIN.  PHYSICAL EXAMINATION:  VITAL SIGNS:  Blood pressure 110/60, pulse 72 and regular, respirations normal.  HEENT:  Negative; carotids normal.  CHEST:  Clear.  HEART:  Reveals a faint, midsystolic click.  There is no murmur, gallop or rub.  ABDOMEN:  Soft and nontender.  There is an old cholecystectomy scar in the right upper quadrant.  There are no abdominal masses or tenderness.  Liver not enlarged.  EXTREMITIES:  Show thick ankles, but no pitting edema.  She has good peripheral  pulses.  Electrocardiogram in the emergency room is normal.  Initial CK MB and Troponin levels are normal.  LABORATORY:  Unremarkable except for a low potassium of 3.3.  Blood sugar 117, nonfasting.  DIAGNOSTIC IMPRESSION: 1. Chest pain; rule out MI. 2. Past history of suspected mitral valve prolapse. 3. Hypokalemia, presumably secondary to hydrochlorothiazide. 4. Status post cholecystectomy. 5. Compensated hypothyroidism. 6. Postmenopausal state, on estrogen replacement therapy. 7. Hyperlipidemia, on Lipitor.  DISPOSITION:  She will be admitted with a telemetry bed.  She will be on IV heparin and IV nitroglycerin.  She will be given aspirin and beta-blockers.  Will continue her home meds.  Serial enzymes will be obtained.  Will plan cardiac catheterization in the a.m. by Dr. Delane Ginger. DD:  02/13/00 TD:  02/13/00 Job: [id number]

## 2011-04-05 NOTE — Consult Note (Signed)
NAMEROXSANA, RIDING                ACCOUNT NO.:  [id number]   MEDICAL RECORD NO.:  [id number]          PATIENT TYPE:  INP   LOCATION:  5502                         FACILITY:  [facility]   PHYSICIAN:  Althea Grimmer. Santogade, M.D.DATE OF BIRTH:  06/30/1930   DATE OF CONSULTATION:  03/15/2005  DATE OF DISCHARGE:                                   CONSULTATION   Ms. Shorkey is a 77 year old female whom I am asked to see for possible  colitis.  She says that she has always had a very sensitive stomach and  vomits easily.  She has a history of chronic nausea, and headaches.  For the  past week she has had an upset stomach with some nausea, she has had neither  hematemesis nor melena.  Today she vomited and her lower abdomen began to  hurt.  At the current time she describes pain across her upper abdomen as  well as diffusely across the lower abdomen.  She has a history of transient  global amnesia and also says that she has a very poor memory and obtaining a  history is quite difficult.  She denies dysphagia, heartburn, weight loss or  diarrhea.  In the emergency room her white blood count and hemoglobin are  normal.  Stool is guaiac negative.  Her transaminases are normal, her  alkaline phosphatase is minimally elevated 154, but in the records it is  noted it was 146 in 2003.  A CT scan of her abdomen demonstrates mild  biliary dilatation which is probably secondary to age and status post  cholecystectomy.  In addition, there is a question raised of descending and  sigmoid colonic thickening, however there is no contrast in the distal  bowel, thus thickening of the wall might simply be due to this and lack of  distention.   PAST MEDICAL HISTORY:  1.  Remote history of peptic ulcer disease and a prior diagnosis of hiatal      hernia.  2.  Mitral valve prolapse.  3.  Hypertension.  4.  Atypical chest pain with a negative cardiac catheterization.  5.  Hyperlipidemia.  6.  Increased blood  pressure.  7.  Reported history of transient global amnesia and memory problems.  8.  Elevated sedimentation rate.  9.  Chronic headaches.  10. Hypothyroidism.   PAST SURGICAL HISTORY:  1.  Cholecystectomy in 1986.  2.  Hysterectomy.  3.  Right total knee arthroplasty in 2003.  4.  Remote history of small bowel resection for adhesions.   CURRENT MEDICATIONS BEING GIVEN IN HOSPITAL:  1.  Hydrochlorothiazide 25 mg daily.  2.  Potassium.  3.  Paxil 20 mg daily.  4.  Atenolol 75 mg daily.  5.  Synthroid 0.15 mg daily.  6.  Premarin.  7.  Fioricet.  8.  Zofran.  9.  Protonix 40 mg IV q.12 hours.   ALLERGIES:  1.  PENICILLIN.  2.  CODEINE.   FAMILY HISTORY:  Noncontributory.   SOCIAL HISTORY:  Nonsmoker, nondrinker, divorced with three children.   REVIEW OF SYSTEMS:  As above.  The patient states  she cannot remember other  things.   PHYSICAL EXAMINATION:  She is afebrile, blood pressure 134/57, pulse 70 and  regular.  SKIN:  Normal.  EYES:  Anicteric, conjunctivae pink, oropharynx unremarkable.  NECK:  Supple without thyromegaly or adenopathy.  CHEST:  Sounds clear.  HEART:  Sounds regular rate and rhythm.  I do not appreciate any murmur.  ABDOMEN:  Soft with normal bowel sounds, there is mild tenderness to deep  palpation in the epigastrium and also in the suprapubic area.  I do not  appreciate any mass, rebound or organomegaly.  RECTAL:  Exam is not performed, but was reported negative in the ER.  EXTREMITIES:  Reveal 1+ edema.   LABORATORY TESTS:  Hemoglobin 13.7, white blood count 8.8, platelet count  normal, electrolytes normal, alkaline phosphatase 154, transaminases normal,  lipase normal, fecal occult blood testing negative.   IMPRESSION:  A 77 year old female with very vague symptoms of abdominal  discomfort in the epigastrium and the low suprapubic area.  Her normal white  count, lack of fever, guaiac negative stool, and lack of diarrhea are not  compatible  with a diagnosis of colitis.  I suspect CT finding is spurious  and related to lack of distention of the left colon with no contrast having  reached that area yet.  It does not appear that her vague upper abdominal  discomfort and nausea with vomiting is anything new, she apparently has had  some evaluation in the past possibly by Dr. Danise Edge, but I do not  have these records available to me.  Her daughter thinks she had a  colonoscopy several years ago.  I do not feel she should have any urgent  endoscopic studies.  I would like to observe her on clear liquids and see if  anything is truly developing in the next 24 hours.      PJS/MEDQ  D:  03/16/2005  T:  03/16/2005  Job:  [id number]   cc:   Darden Palmer., M.D.  [address]  [place]  [place] [address]  Fax: [phone]

## 2011-04-22 ENCOUNTER — Other Ambulatory Visit: Payer: Self-pay | Admitting: *Deleted

## 2011-04-22 MED ORDER — PAROXETINE HCL 20 MG PO TABS: 20.0000 mg | ORAL_TABLET | ORAL | Status: AC

## 2011-04-22 MED ORDER — ATENOLOL 50 MG PO TABS: 50.0000 mg | ORAL_TABLET | Freq: Two times a day (BID) | ORAL | Status: AC

## 2011-04-22 MED ORDER — LEVOTHYROXINE SODIUM 150 MCG PO TABS: 150.0000 ug | ORAL_TABLET | Freq: Every day | ORAL | Status: DC

## 2011-04-22 MED ORDER — DIVALPROEX SODIUM ER 500 MG PO TB24: 500.0000 mg | ORAL_TABLET | Freq: Every day | ORAL | Status: AC

## 2011-04-22 NOTE — Telephone Encounter (Signed)
escribe medication per fax request  

## 2011-05-15 ENCOUNTER — Encounter: Payer: Self-pay | Admitting: Cardiology

## 2011-05-27 ENCOUNTER — Encounter: Payer: Self-pay | Admitting: Cardiology

## 2011-06-06 ENCOUNTER — Encounter: Payer: Self-pay | Admitting: Cardiology

## 2011-08-20 ENCOUNTER — Other Ambulatory Visit: Payer: Self-pay | Admitting: Cardiology

## 2011-08-20 DIAGNOSIS — K219 Gastro-esophageal reflux disease without esophagitis: Secondary | ICD-10-CM

## 2011-08-20 DIAGNOSIS — E039 Hypothyroidism, unspecified: Secondary | ICD-10-CM

## 2011-08-30 LAB — CBC
HCT: 34.7 — ABNORMAL LOW
Hemoglobin: 11.3 — ABNORMAL LOW
MCHC: 32.5
MCV: 84.6
Platelets: 260
RBC: 4.11
RDW: 16.4 — ABNORMAL HIGH
WBC: 9

## 2011-08-30 LAB — DIFFERENTIAL
Basophils Relative: 1
Lymphocytes Relative: 25
Lymphs Abs: 2.3
Monocytes Relative: 8
Neutro Abs: 5.7
Neutrophils Relative %: 64

## 2011-08-30 LAB — URINALYSIS, ROUTINE W REFLEX MICROSCOPIC
Bilirubin Urine: NEGATIVE
Glucose, UA: NEGATIVE
Hgb urine dipstick: NEGATIVE
Ketones, ur: NEGATIVE
Nitrite: NEGATIVE
Protein, ur: NEGATIVE
Specific Gravity, Urine: 1.019
Urobilinogen, UA: 0.2
pH: 6.5

## 2011-08-30 LAB — I-STAT 8, (EC8 V) (CONVERTED LAB)
Acid-Base Excess: 4 — ABNORMAL HIGH
Chloride: 102
HCT: 39
Operator id: 196461
Potassium: 4.6
Sodium: 136
pCO2, Ven: 47.4

## 2011-08-30 LAB — URINE CULTURE: Colony Count: 40000

## 2011-08-30 LAB — POCT I-STAT CREATININE: Creatinine, Ser: 0.8

## 2011-09-02 LAB — BASIC METABOLIC PANEL
BUN: 6
CO2: 27
Calcium: 8.8
Calcium: 9.7
Creatinine, Ser: 0.7
GFR calc Af Amer: >60
GFR calc non Af Amer: >60
Glucose, Bld: 103 — ABNORMAL HIGH
Glucose, Bld: 85
Sodium: 142

## 2011-09-02 LAB — I-STAT 8, (EC8 V) (CONVERTED LAB)
BUN: 12
Bicarbonate: 21
Chloride: 104
Glucose, Bld: 169 — ABNORMAL HIGH
HCT: 23 — ABNORMAL LOW
HCT: 38
Hemoglobin: 7.8 — CL
Operator id: 189501
Potassium: 3.6
Potassium: 4.1
Sodium: 140
TCO2: 22
pCO2, Ven: 46.1
pH, Ven: 7.393 — ABNORMAL HIGH

## 2011-09-02 LAB — CBC
HCT: 31.7 — ABNORMAL LOW
HCT: 35.6 — ABNORMAL LOW
Hemoglobin: 10.4 — ABNORMAL LOW
Hemoglobin: 10.6 — ABNORMAL LOW
Hemoglobin: 11 — ABNORMAL LOW
Hemoglobin: 11.9 — ABNORMAL LOW
Hemoglobin: 13
Hemoglobin: 8.6 — ABNORMAL LOW
MCHC: 32.3
MCHC: 32.4
MCHC: 32.6
MCHC: 33
MCHC: 33.3
MCV: 78.9
MCV: 80.8
MCV: 82.8
MCV: 83.6
MCV: 83.8
Platelets: 346
Platelets: 374
RBC: 3.23 — ABNORMAL LOW
RBC: 3.78 — ABNORMAL LOW
RBC: 3.89
RBC: 4.3
RDW: 16.5 — ABNORMAL HIGH
RDW: 17 — ABNORMAL HIGH
RDW: 17 — ABNORMAL HIGH
WBC: 15 — ABNORMAL HIGH
WBC: 24.8 — ABNORMAL HIGH

## 2011-09-02 LAB — CROSSMATCH: Antibody Screen: NEGATIVE

## 2011-09-02 LAB — DIFFERENTIAL
Basophils Relative: 0
Basophils Relative: 0
Eosinophils Absolute: 0
Eosinophils Absolute: 0.1
Lymphs Abs: 3.1
Neutrophils Relative %: 85 — ABNORMAL HIGH
Neutrophils Relative %: 86 — ABNORMAL HIGH

## 2011-09-02 LAB — URINALYSIS, ROUTINE W REFLEX MICROSCOPIC
Glucose, UA: NEGATIVE
Nitrite: NEGATIVE
Protein, ur: NEGATIVE
pH: 7

## 2011-09-02 LAB — COMPREHENSIVE METABOLIC PANEL
ALT: 13
AST: 19
CO2: 28
Chloride: 111
Creatinine, Ser: 0.84
GFR calc Af Amer: >60
GFR calc non Af Amer: >60
Glucose, Bld: 80
Total Bilirubin: 0.8

## 2011-09-02 LAB — LIPASE, BLOOD: Lipase: 19

## 2011-09-02 LAB — URINE MICROSCOPIC-ADD ON

## 2011-09-02 LAB — SAMPLE TO BLOOD BANK

## 2011-09-02 LAB — PROTIME-INR
INR: 1.2
Prothrombin Time: 15.4 — ABNORMAL HIGH

## 2011-10-30 ENCOUNTER — Encounter (HOSPITAL_COMMUNITY): Payer: Self-pay | Admitting: *Deleted

## 2011-10-30 ENCOUNTER — Other Ambulatory Visit: Payer: Self-pay

## 2011-10-30 ENCOUNTER — Emergency Department (HOSPITAL_COMMUNITY): Payer: Medicare Other

## 2011-10-30 ENCOUNTER — Emergency Department (HOSPITAL_COMMUNITY)
Admission: EM | Admit: 2011-10-30 | Discharge: 2011-10-30 | Disposition: A | Discharge status: Home / Self Care | Payer: Medicare Other | Attending: Emergency Medicine | Admitting: Emergency Medicine

## 2011-10-30 DIAGNOSIS — K219 Gastro-esophageal reflux disease without esophagitis: Secondary | ICD-10-CM | POA: Insufficient documentation

## 2011-10-30 DIAGNOSIS — M542 Cervicalgia: Secondary | ICD-10-CM | POA: Insufficient documentation

## 2011-10-30 DIAGNOSIS — Z043 Encounter for examination and observation following other accident: Secondary | ICD-10-CM | POA: Insufficient documentation

## 2011-10-30 DIAGNOSIS — F0390 Unspecified dementia without behavioral disturbance: Secondary | ICD-10-CM | POA: Insufficient documentation

## 2011-10-30 DIAGNOSIS — I059 Rheumatic mitral valve disease, unspecified: Secondary | ICD-10-CM | POA: Insufficient documentation

## 2011-10-30 DIAGNOSIS — G319 Degenerative disease of nervous system, unspecified: Secondary | ICD-10-CM | POA: Insufficient documentation

## 2011-10-30 DIAGNOSIS — R51 Headache: Secondary | ICD-10-CM | POA: Insufficient documentation

## 2011-10-30 DIAGNOSIS — W19XXXA Unspecified fall, initial encounter: Secondary | ICD-10-CM | POA: Insufficient documentation

## 2011-10-30 DIAGNOSIS — E039 Hypothyroidism, unspecified: Secondary | ICD-10-CM | POA: Insufficient documentation

## 2011-10-30 DIAGNOSIS — E785 Hyperlipidemia, unspecified: Secondary | ICD-10-CM | POA: Insufficient documentation

## 2011-10-30 DIAGNOSIS — Y9210 Unspecified residential institution as the place of occurrence of the external cause: Secondary | ICD-10-CM | POA: Insufficient documentation

## 2011-10-30 DIAGNOSIS — I10 Essential (primary) hypertension: Secondary | ICD-10-CM | POA: Insufficient documentation

## 2011-10-30 NOTE — ED Provider Notes (Signed)
History     CSN: [id number] Arrival date & time: 10/30/2011  4:51 PM   First MD Initiated Contact with Patient 10/30/11 1722      Chief Complaint  Patient presents with  . Fall    (Consider location/radiation/quality/duration/timing/severity/associated sxs/prior treatment) Patient is a 77 y.o. female presenting with fall. The history is provided by the nursing home and a relative. The history is limited by the condition of the patient (Dementia).  Fall  Patient's daughter was called from the nursing home with a report that she had fallen and hit her head and had a large hematoma. Another nursing home personnel states that she fell against the wall and slid down to the floor. The patient has dementia with severe short-term memory loss and is not remember the events, but she is not complaining of any pain. There is no known loss of consciousness. She's not had any vomiting. She was brought in by ambulance. Her treatment has been given to this point.  Past Medical History  Diagnosis Date  . Hypertension   . Hyperlipidemia   . Dementia   . Severe frontal headaches   . Hypothyroidism   . Reflux   . Mitral valve prolapse     History reviewed. No pertinent past surgical history.  History reviewed. No pertinent family history.  History  Substance Use Topics  . Smoking status: Never Smoker   . Smokeless tobacco: Not on file  . Alcohol Use: No    OB History    Grav Para Term Preterm Abortions TAB SAB Ect Mult Living                  Review of Systems  Unable to perform ROS: Dementia    Allergies  Penicillins  Home Medications   Current Outpatient Rx  Name Route Sig Dispense Refill  . ATENOLOL 50 MG PO TABS Oral Take 1 tablet (50 mg total) by mouth 2 (two) times daily. 180 tablet 3  . DIVALPROEX SODIUM ER 500 MG PO TB24 Oral Take 1 tablet (500 mg total) by mouth daily. 90 tablet 3  . FUROSEMIDE 20 MG PO TABS  Take 1 tablet on Monday, Wednesday, and Friday 30 tablet 3   . LEVOTHYROXINE SODIUM 150 MCG PO TABS  TAKE ONE TABLET BY MOUTH DAILY 30 tablet 1    CALL OFFICE FOR APPOINTMENT, NO MORE REFILLS  . LOPERAMIDE HCL 2 MG PO CAPS Oral Take 2 mg by mouth as needed. Take one capsule by mouth for initial dose, then take one as needed for diarrhea.     . OMEPRAZOLE 20 MG PO CPDR  TAKE ONE CAPSULE BY MOUTH DAILY 30 capsule 1    CALL OFFICE FOR APPOINTMENT, NO MORE REFILLS UNTIL ...  . PAROXETINE HCL 20 MG PO TABS Oral Take 1 tablet (20 mg total) by mouth every morning. 90 tablet 3  . POTASSIUM CHLORIDE 10 MEQ PO TBCR Oral Take 10 mEq by mouth daily.        BP 137/51  Pulse 62  Temp 97.8 F (36.6 C)  Resp 14  SpO2 95%  Physical Exam  Nursing note and vitals reviewed.  77 year old female who is awake and alert and in no acute distress. Vital signs are normal. Head is normocephalic and atraumatic. I cannot find any evidence of hematoma. PERRLA, EOMI. Oropharynx is clear. Neck is nontender. Back is nontender. Lungs are clear without rales, wheezes, rhonchi. Heart has regular rate and rhythm without murmur. There is no chest  wall tenderness. Abdomen is soft, flat, and nontender without masses or hepatosplenomegaly.  Extremities have 1+ edema, no cyanosis. There is full range of motion present. Skin is warm and moist without rash. Neurologic: She is oriented to person and place but not time. Cranial nerves are intact, there no motor or sensory deficits.   ED Course  Procedures (including critical care time)  Labs Reviewed - No data to display No results found. Results for orders placed during the hospital encounter of 12/23/09  POCT I-STAT, CHEM 8      Component Value Range   Sodium 137  135 - 145 (mEq/L)   Potassium 3.9  3.5 - 5.1 (mEq/L)   Chloride 105  96 - 112 (mEq/L)   BUN 24 (*) 6 - 23 (mg/dL)   Creatinine, Ser 0.7  0.4 - 1.2 (mg/dL)   Glucose, Bld 045 (*) 70 - 99 (mg/dL)   Calcium, Ion 4.09 (*) 1.12 - 1.32 (mmol/L)   TCO2 28  0 - 100 (mmol/L)    Hemoglobin 15.0  12.0 - 15.0 (g/dL)   HCT 81.1  91.4 - 78.2 (%)  URINALYSIS, ROUTINE W REFLEX MICROSCOPIC      Component Value Range   Color, Urine AMBER BIOCHEMICALS MAY BE AFFECTED BY COLOR (*) YELLOW    APPearance CLEAR  CLEAR    Specific Gravity, Urine 1.025  1.005 - 1.030    pH 6.5  5.0 - 8.0    Glucose, UA NEGATIVE  NEGATIVE (mg/dL)   Hgb urine dipstick NEGATIVE  NEGATIVE    Bilirubin Urine SMALL (*) NEGATIVE    Ketones, ur 15 (*) NEGATIVE (mg/dL)   Protein, ur 30 (*) NEGATIVE (mg/dL)   Urobilinogen, UA 1.0  0.0 - 1.0 (mg/dL)   Nitrite NEGATIVE  NEGATIVE    Leukocytes, UA TRACE (*) NEGATIVE   URINE MICROSCOPIC-ADD ON      Component Value Range   WBC, UA 0-2  <3 (WBC/hpf)   Urine-Other MUCOUS PRESENT    URINE CULTURE      Component Value Range   Specimen Description URINE, CATHETERIZED     Special Requests NONE     Colony Count NO GROWTH     Culture NO GROWTH     Report Status 12/24/2009 FINAL     Ct Head Wo Contrast  10/30/2011  *RADIOLOGY REPORT*  Clinical Data:  Fall.  Head and neck pain  CT HEAD WITHOUT CONTRAST CT CERVICAL SPINE WITHOUT CONTRAST  Technique:  Multidetector CT imaging of the head and cervical spine was performed following the standard protocol without intravenous contrast.  Multiplanar CT image reconstructions of the cervical spine were also generated.  Comparison:  CT 07/28/2009  CT HEAD  Findings: Generalized atrophy which is moderate to advanced.  Mild chronic microvascular ischemia in the white matter.  Negative for acute infarct.  Negative for hemorrhage or mass.  No skull fracture is identified.  IMPRESSION: Atrophy and chronic microvascular ischemia.  No acute intracranial abnormality.  CT CERVICAL SPINE  Findings: Negative for fracture.  3 mm anterior slip C4 and C5.  Moderate disc degeneration and spondylosis at C5-6.  Mild anterior slip C6 on C7 with mild spurring.  Diffuse facet degeneration is present throughout the cervical spine.  Mild  atherosclerotic calcification in the right carotid artery. Negative for mass lesion.  IMPRESSION: Negative for fracture.  Original Report Authenticated By: Camelia Phenes, M.D.   Ct Cervical Spine Wo Contrast  10/30/2011  *RADIOLOGY REPORT*  Clinical Data:  Fall.  Head  and neck pain  CT HEAD WITHOUT CONTRAST CT CERVICAL SPINE WITHOUT CONTRAST  Technique:  Multidetector CT imaging of the head and cervical spine was performed following the standard protocol without intravenous contrast.  Multiplanar CT image reconstructions of the cervical spine were also generated.  Comparison:  CT 07/28/2009  CT HEAD  Findings: Generalized atrophy which is moderate to advanced.  Mild chronic microvascular ischemia in the white matter.  Negative for acute infarct.  Negative for hemorrhage or mass.  No skull fracture is identified.  IMPRESSION: Atrophy and chronic microvascular ischemia.  No acute intracranial abnormality.  CT CERVICAL SPINE  Findings: Negative for fracture.  3 mm anterior slip C4 and C5.  Moderate disc degeneration and spondylosis at C5-6.  Mild anterior slip C6 on C7 with mild spurring.  Diffuse facet degeneration is present throughout the cervical spine.  Mild atherosclerotic calcification in the right carotid artery. Negative for mass lesion.  IMPRESSION: Negative for fracture.  Original Report Authenticated By: Camelia Phenes, M.D.      No diagnosis found.  ECG shows normal sinus rhythm with a rate of 62, no ectopy. Normal axis. Normal P wave. Normal QRS. Normal intervals. Normal ST and T waves. Impression: normal ECG. Compared with ECG of 07/28/2009, no significant changes are seen.  No injury found. Will send back to her assisted living facility. MDM  Fall with no apparent injury. However, history is uncertain as to for people given different versions of the events. CT will be ordered to rule out intracranial injury.        Dione Booze, MD 10/30/11 [phone]

## 2011-10-30 NOTE — ED Notes (Signed)
To ed for eval after leaning up against a wall and sliding down to ground. No injury noted. Hx of dementia. Lives at [facility].

## 2011-11-02 ENCOUNTER — Emergency Department (HOSPITAL_COMMUNITY): Payer: Medicare Other

## 2011-11-02 ENCOUNTER — Encounter (HOSPITAL_COMMUNITY): Payer: Self-pay | Admitting: Adult Health

## 2011-11-02 ENCOUNTER — Emergency Department (HOSPITAL_COMMUNITY)
Admission: EM | Admit: 2011-11-02 | Discharge: 2011-11-02 | Disposition: A | Discharge status: Home / Self Care | Payer: Medicare Other | Attending: Emergency Medicine | Admitting: Emergency Medicine

## 2011-11-02 DIAGNOSIS — Y9210 Unspecified residential institution as the place of occurrence of the external cause: Secondary | ICD-10-CM | POA: Insufficient documentation

## 2011-11-02 DIAGNOSIS — W1809XA Striking against other object with subsequent fall, initial encounter: Secondary | ICD-10-CM | POA: Insufficient documentation

## 2011-11-02 DIAGNOSIS — F0390 Unspecified dementia without behavioral disturbance: Secondary | ICD-10-CM | POA: Insufficient documentation

## 2011-11-02 DIAGNOSIS — M542 Cervicalgia: Secondary | ICD-10-CM | POA: Insufficient documentation

## 2011-11-02 DIAGNOSIS — S329XXA Fracture of unspecified parts of lumbosacral spine and pelvis, initial encounter for closed fracture: Secondary | ICD-10-CM | POA: Insufficient documentation

## 2011-11-02 DIAGNOSIS — R609 Edema, unspecified: Secondary | ICD-10-CM | POA: Insufficient documentation

## 2011-11-02 DIAGNOSIS — R51 Headache: Secondary | ICD-10-CM | POA: Insufficient documentation

## 2011-11-02 DIAGNOSIS — E039 Hypothyroidism, unspecified: Secondary | ICD-10-CM | POA: Insufficient documentation

## 2011-11-02 DIAGNOSIS — I10 Essential (primary) hypertension: Secondary | ICD-10-CM | POA: Insufficient documentation

## 2011-11-02 DIAGNOSIS — W19XXXA Unspecified fall, initial encounter: Secondary | ICD-10-CM

## 2011-11-02 MED ORDER — OXYCODONE-ACETAMINOPHEN 5-325 MG PO TABS: 1.0000 | ORAL_TABLET | ORAL | Status: AC | PRN

## 2011-11-02 NOTE — ED Notes (Signed)
Report called to [facility] spoke to Alba Destine nurse about discharge instructions.

## 2011-11-02 NOTE — ED Notes (Signed)
ZOX:[id number]<AV> Expected date:11/02/11<BR> Expected time:10:59 AM<BR> Means of arrival:Ambulance<BR> Comments:<BR> Fall

## 2011-11-02 NOTE — ED Notes (Signed)
[facility]  [facility]

## 2011-11-02 NOTE — ED Notes (Signed)
From [facility] had a unwitnessed fall while walking across room, stumbled and fell into dresser with right side and hit side of dresser and edge of floor. C/o pain in right hip and right side. States she feels better on spine board. C-collar and LSB. No deformitites noted, bilateral leg swelling that is normal for pt.

## 2011-11-02 NOTE — ED Provider Notes (Signed)
History     CSN: [id number] Arrival date & time: 11/02/2011 11:09 AM   First MD Initiated Contact with Patient 11/02/11 1117      Chief Complaint  Patient presents with  . Fall    (Consider location/radiation/quality/duration/timing/severity/associated sxs/prior treatment) Patient is a 77 y.o. female presenting with fall. The history is provided by the patient and the nursing home. The history is limited by the condition of the patient (Dementia).  Fall  History is from the nursing home record. She is reported to have fallen and hit the side of a dresser. She is reported to have complained of pain in her right hip. She was treated by EMS by being placed in a long spine board with stiff cervical collar and transported to the emergency department. Symptoms are moderate. Pain is worse with movement.  Past Medical History  Diagnosis Date  . Hypertension   . Hyperlipidemia   . Dementia   . Severe frontal headaches   . Hypothyroidism   . Reflux   . Mitral valve prolapse     History reviewed. No pertinent past surgical history.  History reviewed. No pertinent family history.  History  Substance Use Topics  . Smoking status: Never Smoker   . Smokeless tobacco: Not on file  . Alcohol Use: No    OB History    Grav Para Term Preterm Abortions TAB SAB Ect Mult Living                  Review of Systems  Unable to perform ROS: Dementia    Allergies  Penicillins  Home Medications   Current Outpatient Rx  Name Route Sig Dispense Refill  . ACETAMINOPHEN 325 MG PO TABS Oral Take 650 mg by mouth every 6 (six) hours as needed. For pain     . ATENOLOL 50 MG PO TABS Oral Take 1 tablet (50 mg total) by mouth 2 (two) times daily. 180 tablet 3  . DIVALPROEX SODIUM ER 500 MG PO TB24 Oral Take 1 tablet (500 mg total) by mouth daily. 90 tablet 3  . FUROSEMIDE 20 MG PO TABS  Take 1 tablet on Monday, Wednesday, and Friday 30 tablet 3  . LEVOTHYROXINE SODIUM 150 MCG PO TABS  TAKE ONE  TABLET BY MOUTH DAILY 30 tablet 1    CALL OFFICE FOR APPOINTMENT, NO MORE REFILLS  . LOPERAMIDE HCL 2 MG PO CAPS Oral Take 2 mg by mouth as needed. Take one capsule by mouth for initial dose, then take one as needed for diarrhea.     . OMEPRAZOLE 20 MG PO CPDR  TAKE ONE CAPSULE BY MOUTH DAILY 30 capsule 1    CALL OFFICE FOR APPOINTMENT, NO MORE REFILLS UNTIL ...  . PAROXETINE HCL 20 MG PO TABS Oral Take 1 tablet (20 mg total) by mouth every morning. 90 tablet 3  . POTASSIUM CHLORIDE 10 MEQ PO TBCR Oral Take 10 mEq by mouth daily.        BP 125/58  Pulse 65  Temp 98.8 F (37.1 C)  Resp 20  SpO2 93%  Physical Exam  Nursing note and vitals reviewed.  77year-old female on a long spine board with stiff cervical collar in place. She is in no acute distress. Vital signs are normal. Head is normocephalic and atraumatic. PERRLA, EOMI. Oropharynx is clear. Neck is nontender. Back is nontender. Lungs are clear without rales, wheezes, or rhonchi. Heart has regular rate and rhythm without murmur. There is no chest wall tenderness. Abdomen  is soft, flat, nontender without masses or hepatosplenomegaly. Extremities: There is no tenderness to palpation, but there is pain on range of motion of both hips. With hip movement, she is complaining of pain posteriorly. He has 1+ pitting edema. Skin is warm and moist without rashes. Neurologic: She is oriented to person and place but not time. She has no memory of why she is here. Cranial nerves are intact. There no focal motor or sensory deficits.  ED Course  Procedures (including critical care time)  Labs Reviewed - No data to display No results found.   No diagnosis found.  Results for orders placed during the hospital encounter of 12/23/09  POCT I-STAT, CHEM 8      Component Value Range   Sodium 137  135 - 145 (mEq/L)   Potassium 3.9  3.5 - 5.1 (mEq/L)   Chloride 105  96 - 112 (mEq/L)   BUN 24 (*) 6 - 23 (mg/dL)   Creatinine, Ser 0.7  0.4 - 1.2 (mg/dL)    Glucose, Bld 161 (*) 70 - 99 (mg/dL)   Calcium, Ion 0.96 (*) 1.12 - 1.32 (mmol/L)   TCO2 28  0 - 100 (mmol/L)   Hemoglobin 15.0  12.0 - 15.0 (g/dL)   HCT 04.5  40.9 - 81.1 (%)  URINALYSIS, ROUTINE W REFLEX MICROSCOPIC      Component Value Range   Color, Urine AMBER BIOCHEMICALS MAY BE AFFECTED BY COLOR (*) YELLOW    APPearance CLEAR  CLEAR    Specific Gravity, Urine 1.025  1.005 - 1.030    pH 6.5  5.0 - 8.0    Glucose, UA NEGATIVE  NEGATIVE (mg/dL)   Hgb urine dipstick NEGATIVE  NEGATIVE    Bilirubin Urine SMALL (*) NEGATIVE    Ketones, ur 15 (*) NEGATIVE (mg/dL)   Protein, ur 30 (*) NEGATIVE (mg/dL)   Urobilinogen, UA 1.0  0.0 - 1.0 (mg/dL)   Nitrite NEGATIVE  NEGATIVE    Leukocytes, UA TRACE (*) NEGATIVE   URINE MICROSCOPIC-ADD ON      Component Value Range   WBC, UA 0-2  <3 (WBC/hpf)   Urine-Other MUCOUS PRESENT    URINE CULTURE      Component Value Range   Specimen Description URINE, CATHETERIZED     Special Requests NONE     Colony Count NO GROWTH     Culture NO GROWTH     Report Status 12/24/2009 FINAL     Dg Hip Bilateral W/pelvis  11/02/2011  *RADIOLOGY REPORT*  Clinical Data: Fall.  Hip pain.  BILATERAL HIP WITH PELVIS - 4+ VIEW  Comparison: CT abdomen and pelvis 06/12/2007 reviewed.  Findings: The patient has acute appearing left parasymphyseal pubic bone fractures. No other fracture is identified.  The hips are located.  IMPRESSION:  Acute appearing left parasymphyseal pubic bone fractures.  Original Report Authenticated By: Bernadene Bell. Maricela Curet, M.D.   Ct Head Wo Contrast  11/02/2011  *RADIOLOGY REPORT*  Clinical Data:  Fall.  CT HEAD WITHOUT CONTRAST CT CERVICAL SPINE WITHOUT CONTRAST  Technique:  Multidetector CT imaging of the head and cervical spine was performed following the standard protocol without intravenous contrast.  Multiplanar CT image reconstructions of the cervical spine were also generated.  Comparison:  10/30/2011  CT HEAD  Findings: There is  diffuse patchy low density throughout the subcortical and periventricular white matter consistent with chronic small vessel ischemic change.  There is prominence of the sulci and ventricles consistent with brain atrophy.  There is no evidence  for acute brain infarct, hemorrhage or mass.  The paranasal sinuses are clear.  The mastoid air cells are clear.  The skull appears intact.  IMPRESSION:  1.  Small vessel ischemic change and brain atrophy. 2.  No acute intracranial abnormalities.  CT CERVICAL SPINE  Findings: Normal alignment of the cervical spine.  The vertebral body heights are preserved.  Facet joints appear aligned. Multilevel disc space narrowing and ventral spurring is noted. There is no fracture or subluxation identified.  IMPRESSION:  1.  Cervical spondylosis. 2.  No fractures or subluxations.  Original Report Authenticated By: Rosealee Albee, M.D.   Ct Head Wo Contrast  10/30/2011  *RADIOLOGY REPORT*  Clinical Data:  Fall.  Head and neck pain  CT HEAD WITHOUT CONTRAST CT CERVICAL SPINE WITHOUT CONTRAST  Technique:  Multidetector CT imaging of the head and cervical spine was performed following the standard protocol without intravenous contrast.  Multiplanar CT image reconstructions of the cervical spine were also generated.  Comparison:  CT 07/28/2009  CT HEAD  Findings: Generalized atrophy which is moderate to advanced.  Mild chronic microvascular ischemia in the white matter.  Negative for acute infarct.  Negative for hemorrhage or mass.  No skull fracture is identified.  IMPRESSION: Atrophy and chronic microvascular ischemia.  No acute intracranial abnormality.  CT CERVICAL SPINE  Findings: Negative for fracture.  3 mm anterior slip C4 and C5.  Moderate disc degeneration and spondylosis at C5-6.  Mild anterior slip C6 on C7 with mild spurring.  Diffuse facet degeneration is present throughout the cervical spine.  Mild atherosclerotic calcification in the right carotid artery. Negative for mass  lesion.  IMPRESSION: Negative for fracture.  Original Report Authenticated By: Camelia Phenes, M.D.   Ct Cervical Spine Wo Contrast  11/02/2011  *RADIOLOGY REPORT*  Clinical Data:  Fall.  CT HEAD WITHOUT CONTRAST CT CERVICAL SPINE WITHOUT CONTRAST  Technique:  Multidetector CT imaging of the head and cervical spine was performed following the standard protocol without intravenous contrast.  Multiplanar CT image reconstructions of the cervical spine were also generated.  Comparison:  10/30/2011  CT HEAD  Findings: There is diffuse patchy low density throughout the subcortical and periventricular white matter consistent with chronic small vessel ischemic change.  There is prominence of the sulci and ventricles consistent with brain atrophy.  There is no evidence for acute brain infarct, hemorrhage or mass.  The paranasal sinuses are clear.  The mastoid air cells are clear.  The skull appears intact.  IMPRESSION:  1.  Small vessel ischemic change and brain atrophy. 2.  No acute intracranial abnormalities.  CT CERVICAL SPINE  Findings: Normal alignment of the cervical spine.  The vertebral body heights are preserved.  Facet joints appear aligned. Multilevel disc space narrowing and ventral spurring is noted. There is no fracture or subluxation identified.  IMPRESSION:  1.  Cervical spondylosis. 2.  No fractures or subluxations.  Original Report Authenticated By: Rosealee Albee, M.D.   Ct Cervical Spine Wo Contrast  10/30/2011  *RADIOLOGY REPORT*  Clinical Data:  Fall.  Head and neck pain  CT HEAD WITHOUT CONTRAST CT CERVICAL SPINE WITHOUT CONTRAST  Technique:  Multidetector CT imaging of the head and cervical spine was performed following the standard protocol without intravenous contrast.  Multiplanar CT image reconstructions of the cervical spine were also generated.  Comparison:  CT 07/28/2009  CT HEAD  Findings: Generalized atrophy which is moderate to advanced.  Mild chronic microvascular ischemia in the  white  matter.  Negative for acute infarct.  Negative for hemorrhage or mass.  No skull fracture is identified.  IMPRESSION: Atrophy and chronic microvascular ischemia.  No acute intracranial abnormality.  CT CERVICAL SPINE  Findings: Negative for fracture.  3 mm anterior slip C4 and C5.  Moderate disc degeneration and spondylosis at C5-6.  Mild anterior slip C6 on C7 with mild spurring.  Diffuse facet degeneration is present throughout the cervical spine.  Mild atherosclerotic calcification in the right carotid artery. Negative for mass lesion.  IMPRESSION: Negative for fracture.  Original Report Authenticated By: Camelia Phenes, M.D.      MDM  Fall with possible hip injury.        Dione Booze, MD 11/02/11 1330

## 2011-11-08 ENCOUNTER — Other Ambulatory Visit: Payer: Self-pay

## 2011-11-08 ENCOUNTER — Emergency Department (HOSPITAL_COMMUNITY): Payer: Medicare Other

## 2011-11-08 ENCOUNTER — Emergency Department (HOSPITAL_COMMUNITY)
Admission: EM | Admit: 2011-11-08 | Discharge: 2011-11-08 | Disposition: A | Discharge status: Skilled Nursing Facility | Payer: Medicare Other | Attending: Emergency Medicine | Admitting: Emergency Medicine

## 2011-11-08 ENCOUNTER — Encounter (HOSPITAL_COMMUNITY): Payer: Self-pay

## 2011-11-08 DIAGNOSIS — K219 Gastro-esophageal reflux disease without esophagitis: Secondary | ICD-10-CM | POA: Insufficient documentation

## 2011-11-08 DIAGNOSIS — R4182 Altered mental status, unspecified: Secondary | ICD-10-CM | POA: Insufficient documentation

## 2011-11-08 DIAGNOSIS — E039 Hypothyroidism, unspecified: Secondary | ICD-10-CM | POA: Insufficient documentation

## 2011-11-08 DIAGNOSIS — R609 Edema, unspecified: Secondary | ICD-10-CM | POA: Insufficient documentation

## 2011-11-08 DIAGNOSIS — I10 Essential (primary) hypertension: Secondary | ICD-10-CM | POA: Insufficient documentation

## 2011-11-08 DIAGNOSIS — E785 Hyperlipidemia, unspecified: Secondary | ICD-10-CM | POA: Insufficient documentation

## 2011-11-08 DIAGNOSIS — Z79899 Other long term (current) drug therapy: Secondary | ICD-10-CM | POA: Insufficient documentation

## 2011-11-08 DIAGNOSIS — F0390 Unspecified dementia without behavioral disturbance: Secondary | ICD-10-CM | POA: Insufficient documentation

## 2011-11-08 LAB — URINALYSIS, ROUTINE W REFLEX MICROSCOPIC
Glucose, UA: NEGATIVE mg/dL
Hgb urine dipstick: NEGATIVE
Ketones, ur: 15 mg/dL — AB
Leukocytes, UA: NEGATIVE
Nitrite: NEGATIVE
Protein, ur: NEGATIVE mg/dL
Specific Gravity, Urine: 1.034 — ABNORMAL HIGH (ref 1.005–1.030)
Urobilinogen, UA: 4 mg/dL — ABNORMAL HIGH (ref 0.0–1.0)
pH: 6 (ref 5.0–8.0)

## 2011-11-08 LAB — TROPONIN I: Troponin I: <0.3 ng/mL

## 2011-11-08 LAB — BASIC METABOLIC PANEL WITH GFR
BUN: 28 mg/dL — ABNORMAL HIGH (ref 6–23)
CO2: 30 meq/L (ref 19–32)
Calcium: 9.3 mg/dL (ref 8.4–10.5)
Chloride: 102 meq/L (ref 96–112)
Creatinine, Ser: 0.81 mg/dL (ref 0.50–1.10)
GFR calc Af Amer: 79 mL/min — ABNORMAL LOW
GFR calc non Af Amer: 68 mL/min — ABNORMAL LOW
Glucose, Bld: 124 mg/dL — ABNORMAL HIGH (ref 70–99)
Potassium: 4.1 meq/L (ref 3.5–5.1)
Sodium: 140 meq/L (ref 135–145)

## 2011-11-08 LAB — CBC
HCT: 42.3 % (ref 36.0–46.0)
Hemoglobin: 13.6 g/dL (ref 12.0–15.0)
MCH: 29.8 pg (ref 26.0–34.0)
MCHC: 32.2 g/dL (ref 30.0–36.0)
MCV: 92.8 fL (ref 78.0–100.0)
Platelets: 295 10*3/uL (ref 150–400)
RBC: 4.56 MIL/uL (ref 3.87–5.11)
RDW: 13.7 % (ref 11.5–15.5)
WBC: 12.9 10*3/uL — ABNORMAL HIGH (ref 4.0–10.5)

## 2011-11-08 LAB — AMMONIA: Ammonia: 20 umol/L (ref 11–60)

## 2011-11-08 LAB — VALPROIC ACID LEVEL: Valproic Acid Lvl: 51.6 ug/mL (ref 50.0–100.0)

## 2011-11-08 MED ORDER — SODIUM CHLORIDE 0.9 % IV BOLUS (SEPSIS)
500.0000 mL | Freq: Once | INTRAVENOUS | Status: AC
  Administered 2011-11-08: 500 mL via INTRAVENOUS

## 2011-11-08 NOTE — ED Notes (Signed)
Patient transported to CT 

## 2011-11-08 NOTE — ED Notes (Signed)
Pt transported to CT ?

## 2011-11-08 NOTE — ED Notes (Signed)
EDP awared of patient's situation. Pt is alert but disoriented. Left hand grip is weak but grip strength is bilateral. Facial symmetry present. Equal pupil, no slurr speech.

## 2011-11-08 NOTE — ED Notes (Signed)
PT[facility] call and [facility] called. Pt now waiting on PTAR

## 2011-11-08 NOTE — ED Notes (Signed)
Pt eating dinner. Tolerating it well

## 2011-11-08 NOTE — ED Notes (Signed)
MD at bedside. 

## 2011-11-08 NOTE — ED Notes (Signed)
Pt transported back to Nursing facility by [facility]
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
## 2011-11-08 NOTE — ED Notes (Signed)
Per ems pt from [facility]. Nursing staff reports decreased LOC since this AM, decreased Oral intake. ABC intact.

## 2011-11-08 NOTE — ED Provider Notes (Signed)
History    80yF sent for evaluation of decreased mental status. Pt pleasantly demented on my exam. Denies complaints. Denies pain. No SOB. No n/v. No urinary complaints. Pt not sure why is in ED. When told that concern that she isn't acting like her normal self pt laughed and stated" They say that a lot." Pt denies trauma. Per review of records though, pt evaluated in ED about a week ago after fall. Had head and c-spine CT which negative for acute traumatic injury. Pt says ambulates with walker at baseline. Pt on depakote. Unclear what indication is. No report of seizure in PMHx. No seizure activity reported.  CSN: [id number]  Arrival date & time 11/08/11  1425   First MD Initiated Contact with Patient 11/08/11 1454      No chief complaint on file.   (Consider location/radiation/quality/duration/timing/severity/associated sxs/prior treatment) HPI  Past Medical History  Diagnosis Date  . Hypertension   . Hyperlipidemia   . Dementia   . Severe frontal headaches   . Hypothyroidism   . Reflux   . Mitral valve prolapse   . Hypothyroidism     No past surgical history on file.  No family history on file.  History  Substance Use Topics  . Smoking status: Never Smoker   . Smokeless tobacco: Not on file  . Alcohol Use: No    OB History    Grav Para Term Preterm Abortions TAB SAB Ect Mult Living                  Review of Systems  Unable to perform ROS  Unable to perform because pt demented.   Allergies  Other and Penicillins  Home Medications   Current Outpatient Rx  Name Route Sig Dispense Refill  . ATENOLOL 50 MG PO TABS Oral Take 1 tablet (50 mg total) by mouth 2 (two) times daily. 180 tablet 3  . DIVALPROEX SODIUM ER 500 MG PO TB24 Oral Take 1 tablet (500 mg total) by mouth daily. 90 tablet 3  . FUROSEMIDE 20 MG PO TABS  Take 1 tablet on Monday, Wednesday, and Friday 30 tablet 3  . LEVOTHYROXINE SODIUM 150 MCG PO TABS  TAKE ONE TABLET BY MOUTH DAILY 30 tablet 1     CALL OFFICE FOR APPOINTMENT, NO MORE REFILLS  . LOPERAMIDE HCL 2 MG PO CAPS Oral Take 2 mg by mouth as needed. Take one capsule by mouth for initial dose, then take one as needed for diarrhea.     . OMEPRAZOLE 20 MG PO CPDR  TAKE ONE CAPSULE BY MOUTH DAILY 30 capsule 1    CALL OFFICE FOR APPOINTMENT, NO MORE REFILLS UNTIL ...  . OXYCODONE-ACETAMINOPHEN 5-325 MG PO TABS Oral Take 1 tablet by mouth every 4 (four) hours as needed for pain. 20 tablet 0  . PAROXETINE HCL 20 MG PO TABS Oral Take 1 tablet (20 mg total) by mouth every morning. 90 tablet 3  . POTASSIUM CHLORIDE 10 MEQ PO TBCR Oral Take 10 mEq by mouth daily.        BP 129/73  Pulse 76  Temp(Src) 98 F (36.7 C) (Oral)  Resp 20  SpO2 95%  Physical Exam  Nursing note and vitals reviewed. Constitutional: No distress.       Laying in bed. NAD.  HENT:  Head: Normocephalic and atraumatic.  Right Ear: External ear normal.  Left Ear: External ear normal.  Mouth/Throat: Oropharynx is clear and moist.  Eyes: Conjunctivae are normal. Pupils are equal,  round, and reactive to light. Right eye exhibits no discharge. Left eye exhibits no discharge.  Neck: Neck supple.  Cardiovascular: Normal rate, regular rhythm and normal heart sounds.  Exam reveals no gallop and no friction rub.   No murmur heard. Pulmonary/Chest: Effort normal and breath sounds normal. No respiratory distress. She exhibits no tenderness.  Abdominal: Soft. She exhibits no distension. There is no tenderness.  Musculoskeletal: She exhibits edema. She exhibits no tenderness.       Symmetric LE edema. No tenderness. No external signs of trauma.  Lymphadenopathy:    She has no cervical adenopathy.  Neurological: She is alert. No cranial nerve deficit. She exhibits normal muscle tone.  Skin: Skin is warm and dry. She is not diaphoretic.  Psychiatric:       Disoriented to time. Pt very pleasant and agreeable. Keeps talking about "Nadine Counts in the [employer]."    ED Course    Procedures (including critical care time)  Labs Reviewed  CBC - Abnormal; Notable for the following:    WBC 12.9 (*)    All other components within normal limits  BASIC METABOLIC PANEL - Abnormal; Notable for the following:    Glucose, Bld 124 (*)    BUN 28 (*)    GFR calc non Af Amer 68 (*)    GFR calc Af Amer 79 (*)    All other components within normal limits  URINALYSIS, ROUTINE W REFLEX MICROSCOPIC - Abnormal; Notable for the following:    Color, Urine AMBER (*) BIOCHEMICALS MAY BE AFFECTED BY COLOR   Specific Gravity, Urine 1.034 (*)    Bilirubin Urine SMALL (*)    Ketones, ur 15 (*)    Urobilinogen, UA 4.0 (*)    All other components within normal limits  VALPROIC ACID LEVEL  TROPONIN I  AMMONIA  LAB REPORT - SCANNED   Ct Head Wo Contrast  11/08/2011  *RADIOLOGY REPORT*  Clinical Data: Altered mental status.  CT HEAD WITHOUT CONTRAST  Technique:  Contiguous axial images were obtained from the base of the skull through the vertex without contrast.  Comparison: 10/30/2011  Findings: The brain again shows generalized atrophy.  Chronic small vessel changes are present in the hemispheric deep white matter. No sign of acute infarction, mass lesion, hemorrhage, hydrocephalus or extra-axial collection.  Sinuses are clear.  There is a small amount of fluid in the mastoid tip air cells on the left.  IMPRESSION: No acute finding.  Atrophy and chronic small vessel disease.  Original Report Authenticated By: Thomasenia Sales, M.D.    EKG: Rhythm: normal sinus rhythm Rate: 75 Axis: normal Intervals: normal ST segments: NS ST changes. Flattening anteriorly. 1. Altered mental status       MDM  81yF with apparent AMS. Pt with hx of dementia. Medical screening examination and w/u unremarkable aside from mild leukocyotosis. Consider infectious cause as etiology but well appearing, UA not suggestive and afebrile. Do not feel further w/u or empiric tx indicated at this time. Pt is HD  stable and in NAD. IS being DC'd back to monitored environment.        Raeford Razor, MD 11/16/11 1000

## 2011-11-10 ENCOUNTER — Emergency Department (HOSPITAL_COMMUNITY)
Admission: EM | Admit: 2011-11-10 | Discharge: 2011-11-10 | Disposition: A | Discharge status: Skilled Nursing Facility | Payer: Medicare Other | Attending: Emergency Medicine | Admitting: Emergency Medicine

## 2011-11-10 ENCOUNTER — Emergency Department (HOSPITAL_COMMUNITY): Payer: Medicare Other

## 2011-11-10 DIAGNOSIS — R627 Adult failure to thrive: Secondary | ICD-10-CM | POA: Insufficient documentation

## 2011-11-10 DIAGNOSIS — E860 Dehydration: Secondary | ICD-10-CM | POA: Insufficient documentation

## 2011-11-10 DIAGNOSIS — E039 Hypothyroidism, unspecified: Secondary | ICD-10-CM | POA: Insufficient documentation

## 2011-11-10 DIAGNOSIS — I059 Rheumatic mitral valve disease, unspecified: Secondary | ICD-10-CM | POA: Insufficient documentation

## 2011-11-10 DIAGNOSIS — N390 Urinary tract infection, site not specified: Secondary | ICD-10-CM | POA: Insufficient documentation

## 2011-11-10 DIAGNOSIS — F0390 Unspecified dementia without behavioral disturbance: Secondary | ICD-10-CM | POA: Insufficient documentation

## 2011-11-10 DIAGNOSIS — E785 Hyperlipidemia, unspecified: Secondary | ICD-10-CM | POA: Insufficient documentation

## 2011-11-10 DIAGNOSIS — K219 Gastro-esophageal reflux disease without esophagitis: Secondary | ICD-10-CM | POA: Insufficient documentation

## 2011-11-10 DIAGNOSIS — I10 Essential (primary) hypertension: Secondary | ICD-10-CM | POA: Insufficient documentation

## 2011-11-10 LAB — CBC
MCH: 30.3 pg (ref 26.0–34.0)
MCHC: 32.4 g/dL (ref 30.0–36.0)
MCV: 93.3 fL (ref 78.0–100.0)
Platelets: 313 10*3/uL (ref 150–400)
RBC: 4.76 MIL/uL (ref 3.87–5.11)
RDW: 13.8 % (ref 11.5–15.5)

## 2011-11-10 LAB — BASIC METABOLIC PANEL
CO2: 27 mEq/L (ref 19–32)
Calcium: 9.7 mg/dL (ref 8.4–10.5)
Creatinine, Ser: 0.65 mg/dL (ref 0.50–1.10)
GFR calc non Af Amer: 83 mL/min — ABNORMAL LOW (ref >90)
Sodium: 139 mEq/L (ref 135–145)

## 2011-11-10 LAB — URINALYSIS, ROUTINE W REFLEX MICROSCOPIC
Glucose, UA: NEGATIVE mg/dL
Ketones, ur: 15 mg/dL — AB
Protein, ur: NEGATIVE mg/dL
Urobilinogen, UA: 2 mg/dL — ABNORMAL HIGH (ref 0.0–1.0)

## 2011-11-10 LAB — URINE MICROSCOPIC-ADD ON

## 2011-11-10 MED ORDER — CIPROFLOXACIN HCL 500 MG PO TABS: 500.0000 mg | ORAL_TABLET | Freq: Two times a day (BID) | ORAL | Status: AC

## 2011-11-10 MED ORDER — DEXTROSE 5 % IV SOLN
1.0000 g | Freq: Once | INTRAVENOUS | Status: DC
  Filled 2011-11-10: qty 10

## 2011-11-10 MED ORDER — SODIUM CHLORIDE 0.9 % IV SOLN: INTRAVENOUS | Status: DC

## 2011-11-10 MED ORDER — SODIUM CHLORIDE 0.9 % IV BOLUS (SEPSIS): 500.0000 mL | Freq: Once | INTRAVENOUS | Status: DC

## 2011-11-10 NOTE — ED Notes (Signed)
Pt is alert and disoriented. Unaware of her surrounding and situation. Noted with occasional moaning.

## 2011-11-10 NOTE — ED Notes (Signed)
Bed:[facility]ALA<BR> Expected date:11/10/11<BR> Expected time: 3:24 PM<BR> Means of arrival:Ambulance<BR> Comments:<BR> EMS 35 GC. 77 y/o female general illness. Diabetic CBG 300+

## 2011-11-10 NOTE — ED Notes (Signed)
Per EMS pt from [facility] living. Has had rt hip pain with decreased PO intake. Does not want to eat or take her medication. bp 104 palpated.

## 2011-11-10 NOTE — ED Notes (Signed)
Attempted to start PIV x 2; but unable to gain access. IV team notified.

## 2011-11-10 NOTE — ED Provider Notes (Signed)
History     CSN: [id number]  Arrival date & time 11/10/11  1025   First MD Initiated Contact with Patient 11/10/11 1052      Chief Complaint  Patient presents with  . Failure To Thrive    (Consider location/radiation/quality/duration/timing/severity/associated sxs/prior treatment) HPI Comments: Pt with a hx of dementia, was sent over here from NH for decreased PO intake, not wanting to eat or take her meds.  Has here about a week ago after fall, diagnosed with pubic symphysis fx, CT of head/c-spine negative.  Back here 2 days ago for weakness, was sent back to NH.  History limited due to pt's dementia.  Denies complaints  The history is provided by the nursing home and the EMS personnel.    Past Medical History  Diagnosis Date  . Hypertension   . Hyperlipidemia   . Dementia   . Severe frontal headaches   . Hypothyroidism   . Reflux   . Mitral valve prolapse   . Hypothyroidism     No past surgical history on file.  No family history on file.  History  Substance Use Topics  . Smoking status: Never Smoker   . Smokeless tobacco: Not on file  . Alcohol Use: No    OB History    Grav Para Term Preterm Abortions TAB SAB Ect Mult Living                  Review of Systems  Unable to perform ROS   Allergies  Other and Penicillins  Home Medications   Current Outpatient Rx  Name Route Sig Dispense Refill  . ATENOLOL 50 MG PO TABS Oral Take 1 tablet (50 mg total) by mouth 2 (two) times daily. 180 tablet 3  . DIVALPROEX SODIUM ER 500 MG PO TB24 Oral Take 1 tablet (500 mg total) by mouth daily. 90 tablet 3  . FUROSEMIDE 20 MG PO TABS  Take 1 tablet on Monday, Wednesday, and Friday 30 tablet 3  . LEVOTHYROXINE SODIUM 150 MCG PO TABS  TAKE ONE TABLET BY MOUTH DAILY 30 tablet 1    CALL OFFICE FOR APPOINTMENT, NO MORE REFILLS  . LOPERAMIDE HCL 2 MG PO CAPS Oral Take 2 mg by mouth as needed. Take one capsule by mouth for initial dose, then take one as needed for diarrhea.      . OMEPRAZOLE 20 MG PO CPDR  TAKE ONE CAPSULE BY MOUTH DAILY 30 capsule 1    CALL OFFICE FOR APPOINTMENT, NO MORE REFILLS UNTIL ...  . OXYCODONE-ACETAMINOPHEN 5-325 MG PO TABS Oral Take 1 tablet by mouth every 4 (four) hours as needed for pain. 20 tablet 0  . PAROXETINE HCL 20 MG PO TABS Oral Take 1 tablet (20 mg total) by mouth every morning. 90 tablet 3  . POTASSIUM CHLORIDE 10 MEQ PO TBCR Oral Take 10 mEq by mouth daily.      Marland Kitchen CIPROFLOXACIN HCL 500 MG PO TABS Oral Take 1 tablet (500 mg total) by mouth 2 (two) times daily. 14 tablet 0    BP 113/51  Pulse 67  Temp(Src) 98.5 F (36.9 C) (Oral)  Resp 18  SpO2 98%  Physical Exam  Constitutional: She appears well-developed and well-nourished.  HENT:  Head: Normocephalic and atraumatic.       Dry mucus membranes  Eyes: Pupils are equal, round, and reactive to light.  Neck: Normal range of motion. Neck supple.  Cardiovascular: Normal rate, regular rhythm and normal heart sounds.   Pulmonary/Chest:  Effort normal and breath sounds normal. No respiratory distress. She has no wheezes. She has no rales. She exhibits no tenderness.  Abdominal: Soft. Bowel sounds are normal. There is no tenderness. There is no rebound and no guarding.  Musculoskeletal: Normal range of motion. She exhibits no edema.       Moderate tenderness to palpation of pubic symphysis and movement of hips  Lymphadenopathy:    She has no cervical adenopathy.  Neurological: She is alert.       Oriented to person.  Moves all extremities, does not follow commands  Skin: Skin is warm and dry. No rash noted.  Psychiatric: She has a normal mood and affect.    ED Course  Procedures (including critical care time)  Results for orders placed during the hospital encounter of 11/10/11  CBC      Component Value Range   WBC 11.1 (*) 4.0 - 10.5 (K/uL)   RBC 4.76  3.87 - 5.11 (MIL/uL)   Hemoglobin 14.4  12.0 - 15.0 (g/dL)   HCT 16.1  09.6 - 04.5 (%)   MCV 93.3  78.0 - 100.0  (fL)   MCH 30.3  26.0 - 34.0 (pg)   MCHC 32.4  30.0 - 36.0 (g/dL)   RDW 40.9  81.1 - 91.4 (%)   Platelets 313  150 - 400 (K/uL)  BASIC METABOLIC PANEL      Component Value Range   Sodium 139  135 - 145 (mEq/L)   Potassium 3.9  3.5 - 5.1 (mEq/L)   Chloride 101  96 - 112 (mEq/L)   CO2 27  19 - 32 (mEq/L)   Glucose, Bld 92  70 - 99 (mg/dL)   BUN 21  6 - 23 (mg/dL)   Creatinine, Ser 7.82  0.50 - 1.10 (mg/dL)   Calcium 9.7  8.4 - 95.6 (mg/dL)   GFR calc non Af Amer 83 (*) >90 (mL/min)   GFR calc Af Amer >90  >90 (mL/min)  URINALYSIS, ROUTINE W REFLEX MICROSCOPIC      Component Value Range   Color, Urine AMBER (*) YELLOW    APPearance CLOUDY (*) CLEAR    Specific Gravity, Urine 1.030  1.005 - 1.030    pH 6.5  5.0 - 8.0    Glucose, UA NEGATIVE  NEGATIVE (mg/dL)   Hgb urine dipstick SMALL (*) NEGATIVE    Bilirubin Urine NEGATIVE  NEGATIVE    Ketones, ur 15 (*) NEGATIVE (mg/dL)   Protein, ur NEGATIVE  NEGATIVE (mg/dL)   Urobilinogen, UA 2.0 (*) 0.0 - 1.0 (mg/dL)   Nitrite POSITIVE (*) NEGATIVE    Leukocytes, UA SMALL (*) NEGATIVE   URINE MICROSCOPIC-ADD ON      Component Value Range   Squamous Epithelial / LPF FEW (*) RARE    WBC, UA 7-10  <3 (WBC/hpf)   RBC / HPF 3-6  <3 (RBC/hpf)   Bacteria, UA MANY (*) RARE    Urine-Other MUCOUS PRESENT     Dg Chest 2 View  11/08/2011  *RADIOLOGY REPORT*  Clinical Data: Status.  Weakness.  CHEST - 2 VIEW  Comparison: 07/18/2007.  Findings: Low volume chest.  Basilar atelectasis.  Low volumes accentuate the size of the cardiopericardial silhouette.  No airspace disease.  No effusion.  Osteopenia. Monitoring leads are projected over the chest. Partially visualized right humeral nail.  IMPRESSION: Low volume chest with basilar atelectasis.  Original Report Authenticated By: Andreas Newport, M.D.   Dg Hip Complete Left  11/10/2011  *RADIOLOGY REPORT*  Clinical  Data: Chronic bilateral hip pain.  Recent fall.  LEFT HIP - COMPLETE 2+ VIEW  Comparison:  11/02/2011  Findings: Comminuted, mildly displaced fractures through the left pubic bone, superior pubic ramus and inferior pubic ramus.  This has a similar appearance.  No additional acute bony abnormality. Diffuse osteopenia.  Degenerative changes in the hips and lower lumbar spine.  IMPRESSION: Stable appearance of the mildly displaced parasymphyseal left pubic bone fractures.  Original Report Authenticated By: Cyndie Chime, M.D.   Dg Hip Complete Right  11/10/2011  *RADIOLOGY REPORT*  Clinical Data: Chronic bilateral hip pain.  RIGHT HIP - COMPLETE 2+ VIEW  Comparison: 11/02/2011  Findings: Degenerative changes in the right hip.  No acute bony abnormality in the right hip.  No fracture, subluxation or dislocation.  IMPRESSION: No acute findings in the right hip.  Original Report Authenticated By: Cyndie Chime, M.D.   Dg Hip Bilateral W/pelvis  11/02/2011  *RADIOLOGY REPORT*  Clinical Data: Fall.  Hip pain.  BILATERAL HIP WITH PELVIS - 4+ VIEW  Comparison: CT abdomen and pelvis 06/12/2007 reviewed.  Findings: The patient has acute appearing left parasymphyseal pubic bone fractures. No other fracture is identified.  The hips are located.  IMPRESSION:  Acute appearing left parasymphyseal pubic bone fractures.  Original Report Authenticated By: Bernadene Bell. Maricela Curet, M.D.   Ct Head Wo Contrast  11/08/2011  *RADIOLOGY REPORT*  Clinical Data: Altered mental status.  CT HEAD WITHOUT CONTRAST  Technique:  Contiguous axial images were obtained from the base of the skull through the vertex without contrast.  Comparison: 10/30/2011  Findings: The brain again shows generalized atrophy.  Chronic small vessel changes are present in the hemispheric deep white matter. No sign of acute infarction, mass lesion, hemorrhage, hydrocephalus or extra-axial collection.  Sinuses are clear.  There is a small amount of fluid in the mastoid tip air cells on the left.  IMPRESSION: No acute finding.  Atrophy and chronic small  vessel disease.  Original Report Authenticated By: Thomasenia Sales, M.D.   Ct Head Wo Contrast  11/02/2011  *RADIOLOGY REPORT*  Clinical Data:  Fall.  CT HEAD WITHOUT CONTRAST CT CERVICAL SPINE WITHOUT CONTRAST  Technique:  Multidetector CT imaging of the head and cervical spine was performed following the standard protocol without intravenous contrast.  Multiplanar CT image reconstructions of the cervical spine were also generated.  Comparison:  10/30/2011  CT HEAD  Findings: There is diffuse patchy low density throughout the subcortical and periventricular white matter consistent with chronic small vessel ischemic change.  There is prominence of the sulci and ventricles consistent with brain atrophy.  There is no evidence for acute brain infarct, hemorrhage or mass.  The paranasal sinuses are clear.  The mastoid air cells are clear.  The skull appears intact.  IMPRESSION:  1.  Small vessel ischemic change and brain atrophy. 2.  No acute intracranial abnormalities.  CT CERVICAL SPINE  Findings: Normal alignment of the cervical spine.  The vertebral body heights are preserved.  Facet joints appear aligned. Multilevel disc space narrowing and ventral spurring is noted. There is no fracture or subluxation identified.  IMPRESSION:  1.  Cervical spondylosis. 2.  No fractures or subluxations.  Original Report Authenticated By: Rosealee Albee, M.D.   Ct Head Wo Contrast  10/30/2011  *RADIOLOGY REPORT*  Clinical Data:  Fall.  Head and neck pain  CT HEAD WITHOUT CONTRAST CT CERVICAL SPINE WITHOUT CONTRAST  Technique:  Multidetector CT imaging of the head and cervical  spine was performed following the standard protocol without intravenous contrast.  Multiplanar CT image reconstructions of the cervical spine were also generated.  Comparison:  CT 07/28/2009  CT HEAD  Findings: Generalized atrophy which is moderate to advanced.  Mild chronic microvascular ischemia in the white matter.  Negative for acute infarct.   Negative for hemorrhage or mass.  No skull fracture is identified.  IMPRESSION: Atrophy and chronic microvascular ischemia.  No acute intracranial abnormality.  CT CERVICAL SPINE  Findings: Negative for fracture.  3 mm anterior slip C4 and C5.  Moderate disc degeneration and spondylosis at C5-6.  Mild anterior slip C6 on C7 with mild spurring.  Diffuse facet degeneration is present throughout the cervical spine.  Mild atherosclerotic calcification in the right carotid artery. Negative for mass lesion.  IMPRESSION: Negative for fracture.  Original Report Authenticated By: Camelia Phenes, M.D.   Ct Cervical Spine Wo Contrast  11/02/2011  *RADIOLOGY REPORT*  Clinical Data:  Fall.  CT HEAD WITHOUT CONTRAST CT CERVICAL SPINE WITHOUT CONTRAST  Technique:  Multidetector CT imaging of the head and cervical spine was performed following the standard protocol without intravenous contrast.  Multiplanar CT image reconstructions of the cervical spine were also generated.  Comparison:  10/30/2011  CT HEAD  Findings: There is diffuse patchy low density throughout the subcortical and periventricular white matter consistent with chronic small vessel ischemic change.  There is prominence of the sulci and ventricles consistent with brain atrophy.  There is no evidence for acute brain infarct, hemorrhage or mass.  The paranasal sinuses are clear.  The mastoid air cells are clear.  The skull appears intact.  IMPRESSION:  1.  Small vessel ischemic change and brain atrophy. 2.  No acute intracranial abnormalities.  CT CERVICAL SPINE  Findings: Normal alignment of the cervical spine.  The vertebral body heights are preserved.  Facet joints appear aligned. Multilevel disc space narrowing and ventral spurring is noted. There is no fracture or subluxation identified.  IMPRESSION:  1.  Cervical spondylosis. 2.  No fractures or subluxations.  Original Report Authenticated By: Rosealee Albee, M.D.   Ct Cervical Spine Wo  Contrast  10/30/2011  *RADIOLOGY REPORT*  Clinical Data:  Fall.  Head and neck pain  CT HEAD WITHOUT CONTRAST CT CERVICAL SPINE WITHOUT CONTRAST  Technique:  Multidetector CT imaging of the head and cervical spine was performed following the standard protocol without intravenous contrast.  Multiplanar CT image reconstructions of the cervical spine were also generated.  Comparison:  CT 07/28/2009  CT HEAD  Findings: Generalized atrophy which is moderate to advanced.  Mild chronic microvascular ischemia in the white matter.  Negative for acute infarct.  Negative for hemorrhage or mass.  No skull fracture is identified.  IMPRESSION: Atrophy and chronic microvascular ischemia.  No acute intracranial abnormality.  CT CERVICAL SPINE  Findings: Negative for fracture.  3 mm anterior slip C4 and C5.  Moderate disc degeneration and spondylosis at C5-6.  Mild anterior slip C6 on C7 with mild spurring.  Diffuse facet degeneration is present throughout the cervical spine.  Mild atherosclerotic calcification in the right carotid artery. Negative for mass lesion.  IMPRESSION: Negative for fracture.  Original Report Authenticated By: Camelia Phenes, M.D.      1. UTI (lower urinary tract infection)   2. Dehydration       MDM  Pt with UTI/dehydration.  Has been given IVFs, rocephin, urine culture sent.  Labs okay.  Since this is third visit in  last week, consulted hospitalist for admission/abx for UTI.  Hospitalist did come and see pt and does not feel at this time that pt needs to be admitted.  Pt was sitting up in bed/eating lunch.  No vomiting.  Will d/c back to [facility]       Rolan Bucco, MD 11/10/11 1352

## 2011-11-10 NOTE — ED Notes (Signed)
Pt transferred to SNF.left unit on strectcher via ambulance transport. Left in good condition.

## 2011-11-10 NOTE — Consult Note (Signed)
Requesting physician: Dr. Fredderick Phenix from ED  Reason for consultation: Decreased po intake, uti, evaluate for admission   History of Present Illness: This is a 77 year old female, who is a resident of a nursing home. Patient has dementia, hypertension, hyperlipidemia, hypothyroidism. She was recently seen in the emergency room after a fall and was diagnosed with a pubic symphysis fracture. She was placed on pain control and was sent back to the nursing home. She was again brought back to the emergency room for decrease in mentation. She was evaluated in the emergency room and felt that she was awake and appropriate for dementia. She was subsequently sent back to the nursing home. She was brought back to the emergency room today for failure to thrive, decreased by mouth intake, apparently she was not eating or drinking anything. She was evaluated in the emergency room and felt that she may have some mild dehydration. Blood work was essentially unremarkable. Urinalysis shows a possible urinary tract infection. The patient has been referred for possible admission  On my arrival patient is lying comfortably in bed. As she does not appear to be in any acute pain. The nurses also at the bedside and feeding the patient. The patient is eating most of what is on her tray. She denies any complaints at this time. She is pleasantly confused.  Allergies:   Allergies  Allergen Reactions  . Other     Novacaine? And Other "caine" meds   . Penicillins     Randie Heinz meds      Past Medical History  Diagnosis Date  . Hypertension   . Hyperlipidemia   . Dementia   . Severe frontal headaches   . Hypothyroidism   . Reflux   . Mitral valve prolapse   . Hypothyroidism     No past surgical history on file.  Scheduled Meds:   . sodium chloride   Intravenous STAT  . cefTRIAXone (ROCEPHIN)  IV  1 g Intravenous Once  . sodium chloride  500 mL Intravenous Once   Continuous Infusions:  PRN Meds:.  Social  History:  reports that she has never smoked. She does not have any smokeless tobacco history on file. She reports that she does not drink alcohol or use illicit drugs.  No family history on file.  Review of Systems:  Constitutional: Denies fever, chills, diaphoresis, appetite change and fatigue.  HEENT: Denies photophobia, eye pain, redness, hearing loss, ear pain, congestion, sore throat, rhinorrhea, sneezing, mouth sores, trouble swallowing, neck pain, neck stiffness and tinnitus.   Respiratory: Denies SOB, DOE, cough, chest tightness,  and wheezing.   Cardiovascular: Denies chest pain, palpitations and leg swelling.  Gastrointestinal: Denies nausea, vomiting, abdominal pain, diarrhea, constipation, blood in stool and abdominal distention.  Genitourinary: Denies dysuria, urgency, frequency, hematuria, flank pain and difficulty urinating.  Musculoskeletal: Denies myalgias, back pain, joint swelling, arthralgias and gait problem.  Skin: Denies pallor, rash and wound.  Neurological: Denies dizziness, seizures, syncope, weakness, light-headedness, numbness and headaches.  Hematological: Denies adenopathy. Easy bruising, personal or family bleeding history  Psychiatric/Behavioral: Denies suicidal ideation, mood changes, confusion, nervousness, sleep disturbance and agitation   Physical Exam: Blood pressure 113/51, pulse 67, temperature 98.5 F (36.9 C), temperature source Oral, resp. rate 18, SpO2 98.00%. General: No acute distress, awake, cooperative, pleasant HEENT: Normocephalic, atraumatic, pupils are equal round reactive to light Neck is supple. Chest clear to auscultation bilaterally Cardiac: S1, S2, regular rate and rhythm Abdomen: Soft, nontender, bowel sounds are active Extremities: No cyanosis, clubbing,  edema Neurologic: Appears to be grossly intact, without any focal deficits.  Labs on Admission:  Results for orders placed during the hospital encounter of 11/10/11 (from the  past 48 hour(s))  URINALYSIS, ROUTINE W REFLEX MICROSCOPIC     Status: Abnormal   Collection Time   11/10/11 11:33 AM      Component Value Range Comment   Color, Urine AMBER (*) YELLOW  BIOCHEMICALS MAY BE AFFECTED BY COLOR   APPearance CLOUDY (*) CLEAR     Specific Gravity, Urine 1.030  1.005 - 1.030     pH 6.5  5.0 - 8.0     Glucose, UA NEGATIVE  NEGATIVE (mg/dL)    Hgb urine dipstick SMALL (*) NEGATIVE     Bilirubin Urine NEGATIVE  NEGATIVE     Ketones, ur 15 (*) NEGATIVE (mg/dL)    Protein, ur NEGATIVE  NEGATIVE (mg/dL)    Urobilinogen, UA 2.0 (*) 0.0 - 1.0 (mg/dL)    Nitrite POSITIVE (*) NEGATIVE     Leukocytes, UA SMALL (*) NEGATIVE    URINE MICROSCOPIC-ADD ON     Status: Abnormal   Collection Time   11/10/11 11:33 AM      Component Value Range Comment   Squamous Epithelial / LPF FEW (*) RARE     WBC, UA 7-10  <3 (WBC/hpf)    RBC / HPF 3-6  <3 (RBC/hpf)    Bacteria, UA MANY (*) RARE     Urine-Other MUCOUS PRESENT     CBC     Status: Abnormal   Collection Time   11/10/11 11:44 AM      Component Value Range Comment   WBC 11.1 (*) 4.0 - 10.5 (K/uL)    RBC 4.76  3.87 - 5.11 (MIL/uL)    Hemoglobin 14.4  12.0 - 15.0 (g/dL)    HCT 04.5  40.9 - 81.1 (%)    MCV 93.3  78.0 - 100.0 (fL)    MCH 30.3  26.0 - 34.0 (pg)    MCHC 32.4  30.0 - 36.0 (g/dL)    RDW 91.4  78.2 - 95.6 (%)    Platelets 313  150 - 400 (K/uL)   BASIC METABOLIC PANEL     Status: Abnormal   Collection Time   11/10/11 11:44 AM      Component Value Range Comment   Sodium 139  135 - 145 (mEq/L)    Potassium 3.9  3.5 - 5.1 (mEq/L)    Chloride 101  96 - 112 (mEq/L)    CO2 27  19 - 32 (mEq/L)    Glucose, Bld 92  70 - 99 (mg/dL)    BUN 21  6 - 23 (mg/dL)    Creatinine, Ser 2.13  0.50 - 1.10 (mg/dL)    Calcium 9.7  8.4 - 10.5 (mg/dL)    GFR calc non Af Amer 83 (*) >90 (mL/min)    GFR calc Af Amer >90  >90 (mL/min)     Radiological Exams on Admission: Dg Chest 2 View  11/08/2011  *RADIOLOGY REPORT*   Clinical Data: Status.  Weakness.  CHEST - 2 VIEW  Comparison: 07/18/2007.  Findings: Low volume chest.  Basilar atelectasis.  Low volumes accentuate the size of the cardiopericardial silhouette.  No airspace disease.  No effusion.  Osteopenia. Monitoring leads are projected over the chest. Partially visualized right humeral nail.  IMPRESSION: Low volume chest with basilar atelectasis.  Original Report Authenticated By: Andreas Newport, M.D.   Dg Hip Complete Left  11/10/2011  *RADIOLOGY  REPORT*  Clinical Data: Chronic bilateral hip pain.  Recent fall.  LEFT HIP - COMPLETE 2+ VIEW  Comparison: 11/02/2011  Findings: Comminuted, mildly displaced fractures through the left pubic bone, superior pubic ramus and inferior pubic ramus.  This has a similar appearance.  No additional acute bony abnormality. Diffuse osteopenia.  Degenerative changes in the hips and lower lumbar spine.  IMPRESSION: Stable appearance of the mildly displaced parasymphyseal left pubic bone fractures.  Original Report Authenticated By: Cyndie Chime, M.D.   Dg Hip Complete Right  11/10/2011  *RADIOLOGY REPORT*  Clinical Data: Chronic bilateral hip pain.  RIGHT HIP - COMPLETE 2+ VIEW  Comparison: 11/02/2011  Findings: Degenerative changes in the right hip.  No acute bony abnormality in the right hip.  No fracture, subluxation or dislocation.  IMPRESSION: No acute findings in the right hip.  Original Report Authenticated By: Cyndie Chime, M.D.   Ct Head Wo Contrast  11/08/2011  *RADIOLOGY REPORT*  Clinical Data: Altered mental status.  CT HEAD WITHOUT CONTRAST  Technique:  Contiguous axial images were obtained from the base of the skull through the vertex without contrast.  Comparison: 10/30/2011  Findings: The brain again shows generalized atrophy.  Chronic small vessel changes are present in the hemispheric deep white matter. No sign of acute infarction, mass lesion, hemorrhage, hydrocephalus or extra-axial collection.  Sinuses are  clear.  There is a small amount of fluid in the mastoid tip air cells on the left.  IMPRESSION: No acute finding.  Atrophy and chronic small vessel disease.  Original Report Authenticated By: Thomasenia Sales, M.D.    Assessment/Plan #1. Urinary tract infection. Patient has a urinalysis that is indicative of possible urinary tract infection. Her WBC count is only mildly elevated at 11.1. She has received a dose of Rocephin in the emergency room. I do not feel that she needs further inpatient IV antibiotics at this time. She is afebrile. She should be placed on a course of oral antibiotic. Possibly a course of Levaquin or ciprofloxacin. #2. Failure to thrive. Patient appears to be eating well when assisted by the nurse. She will likely need further assistance with meals at the nursing facility. She is encouraged to drink plenty of fluids. This should also be encouraged at the nursing home. #3 recent pubic symphysis fracture. For supportive treatment. Patient appears comfortable at this time and should be able to be controlled with oral pain medicine.  Disposition. I do not feel that the patient requires inpatient hospitalization for further treatment. She can be further managed at her nursing facility. If she continues to deny any oral intake, then this should be further addressed at the nursing facility. The patient does have dementia and a goals of care meeting would be appropriate. Palliative care consult would also be helpful. The patient appears stable for transfer back to the nursing home today.  Time Spent on Consultation:  Karmen Altamirano [facility]  11/10/2011, 1:29 PM

## 2011-11-10 NOTE — ED Notes (Signed)
Bed:WA24<BR> Expected date:<BR> Expected time:<BR> Means of arrival:<BR> Comments:<BR> EMS

## 2011-11-12 LAB — URINE CULTURE: Colony Count: >=100000

## 2011-11-13 NOTE — ED Notes (Signed)
+   urine Patient treated with Cipro-sensitive to same-chart appended per protocol MD. 

## 2012-01-07 ENCOUNTER — Emergency Department (HOSPITAL_COMMUNITY)
Admission: EM | Admit: 2012-01-07 | Discharge: 2012-01-07 | Disposition: A | Discharge status: Home / Self Care | Payer: Medicare Other | Attending: Emergency Medicine | Admitting: Emergency Medicine

## 2012-01-07 ENCOUNTER — Other Ambulatory Visit: Payer: Self-pay

## 2012-01-07 ENCOUNTER — Emergency Department (HOSPITAL_COMMUNITY): Payer: Medicare Other

## 2012-01-07 ENCOUNTER — Encounter (HOSPITAL_COMMUNITY): Payer: Self-pay

## 2012-01-07 DIAGNOSIS — F411 Generalized anxiety disorder: Secondary | ICD-10-CM | POA: Insufficient documentation

## 2012-01-07 DIAGNOSIS — R4182 Altered mental status, unspecified: Secondary | ICD-10-CM | POA: Insufficient documentation

## 2012-01-07 DIAGNOSIS — W19XXXA Unspecified fall, initial encounter: Secondary | ICD-10-CM | POA: Insufficient documentation

## 2012-01-07 DIAGNOSIS — M25559 Pain in unspecified hip: Secondary | ICD-10-CM | POA: Insufficient documentation

## 2012-01-07 DIAGNOSIS — S0003XA Contusion of scalp, initial encounter: Secondary | ICD-10-CM | POA: Insufficient documentation

## 2012-01-07 DIAGNOSIS — T1490XA Injury, unspecified, initial encounter: Secondary | ICD-10-CM | POA: Insufficient documentation

## 2012-01-07 DIAGNOSIS — I10 Essential (primary) hypertension: Secondary | ICD-10-CM | POA: Insufficient documentation

## 2012-01-07 DIAGNOSIS — M542 Cervicalgia: Secondary | ICD-10-CM | POA: Insufficient documentation

## 2012-01-07 DIAGNOSIS — Y9210 Unspecified residential institution as the place of occurrence of the external cause: Secondary | ICD-10-CM | POA: Insufficient documentation

## 2012-01-07 DIAGNOSIS — E039 Hypothyroidism, unspecified: Secondary | ICD-10-CM | POA: Insufficient documentation

## 2012-01-07 DIAGNOSIS — F0390 Unspecified dementia without behavioral disturbance: Secondary | ICD-10-CM | POA: Insufficient documentation

## 2012-01-07 LAB — COMPREHENSIVE METABOLIC PANEL
ALT: 9 U/L (ref 0–35)
AST: 19 U/L (ref 0–37)
Albumin: 3.1 g/dL — ABNORMAL LOW (ref 3.5–5.2)
CO2: 25 mEq/L (ref 19–32)
Calcium: 9.3 mg/dL (ref 8.4–10.5)
Sodium: 134 mEq/L — ABNORMAL LOW (ref 135–145)
Total Protein: 8.1 g/dL (ref 6.0–8.3)

## 2012-01-07 LAB — CBC
MCHC: 31.8 g/dL (ref 30.0–36.0)
MCV: 93.4 fL (ref 78.0–100.0)
Platelets: 281 10*3/uL (ref 150–400)
RDW: 14.6 % (ref 11.5–15.5)
WBC: 14.4 10*3/uL — ABNORMAL HIGH (ref 4.0–10.5)

## 2012-01-07 LAB — DIFFERENTIAL
Basophils Absolute: 0.1 10*3/uL (ref 0.0–0.1)
Basophils Relative: 1 % (ref 0–1)
Eosinophils Absolute: 0.7 10*3/uL (ref 0.0–0.7)
Eosinophils Relative: 5 % (ref 0–5)
Lymphocytes Relative: 23 % (ref 12–46)

## 2012-01-07 LAB — URINALYSIS, ROUTINE W REFLEX MICROSCOPIC
Hgb urine dipstick: NEGATIVE
Leukocytes, UA: NEGATIVE
Protein, ur: NEGATIVE mg/dL
Specific Gravity, Urine: 1.031 — ABNORMAL HIGH (ref 1.005–1.030)
pH: 5.5 (ref 5.0–8.0)

## 2012-01-07 LAB — POCT I-STAT TROPONIN I

## 2012-01-07 NOTE — ED Notes (Signed)
Pt moved to Room 10 from hall bed-pt is awake and confused to time and place-only oriented to person-pt currently is denying any pain at this time-pt states she does not remember events of this evening-pt has a hx of dementia-pt remains on LSB-will make provider aware

## 2012-01-07 NOTE — ED Notes (Signed)
Unable to dress patient in owned clothing for transport back home. Clothes are soiled with an unknown substance. Patient has on bedroom shoes and blankets and is ready for [facility] transport.

## 2012-01-07 NOTE — ED Notes (Signed)
Pt arrives by [facility] from [facility]-pt had a non-witnessed fall in the lobby of this facility 45 minutes ago and has right hip, cervical and upper thoracic pain-pt is on LSB-pt with no LOC-slipped and fell-abrasion to her right cheek-dried blood under nares

## 2012-01-07 NOTE — Discharge Instructions (Signed)
Fall Prevention, Elderly Falls are the leading cause of injuries, accidents, and accidental deaths in people over the age of 65. Falling is a real threat to your ability to live on your own. CAUSES   Poor eyesight or poor hearing can make you more likely to fall.   Illnesses and physical conditions can affect your strength and balance.   Poor lighting, throw rugs and pets in your home can make you more likely to trip or slip.   The side effects of some medicines can upset your balance and lead to falling. These include medicines for depression, sleep problems, high blood pressure, diabetes, and heart conditions.  PREVENTION  Be sure your home is as safe as possible. Here are some tips:  Wear shoes with non-skid soles (not house slippers).   Be sure your home and outside area are well lit.   Use night lights throughout your house, including hallways and stairways.   Remove clutter and clean up spills on floors and walkways.   Remove throw rugs or fasten them to the floor with carpet tape. Tack down carpet edges.   Do not place electrical cords across pathways.   Install grab bars in your bathtub, shower, and toilet area. Towel bars should not be used as a grab bar.   Install handrails on both sides of stairways.   Do not climb on stools or stepladders. Get someone else to help with jobs that require climbing.   Do not wax your floors at all, or use a non-skid wax.   Repair uneven or unsafe sidewalks, walkways or stairs.   Keep frequently used items within reach.   Be aware of pets so you do not trip.  Get regular check-ups from your doctor, and take good care of yourself:  Have your eyes checked every year for vision changes, cataracts, glaucoma, and other eye problems. Wear eyeglasses as directed.   Have your hearing checked every 2 years, or anytime you or others think that you cannot hear well. Use hearing aids as directed.   See your caregiver if you have foot pain or  corns. Sore feet can contribute to falls.   Let your caregiver know if a medicine is making you feel dizzy or making you lose your balance.   Use a cane, walker, or wheelchair as directed. Use walker or wheelchair brakes when getting in and out.   When you get up from bed, sit on the side of the bed for 1 to 2 minutes before you stand up. This will give your blood pressure time to adjust, and you will feel less dizzy.   If you need to go to the bathroom often, consider using a bedside commode.  Keep your body in good shape:  Get regular exercise, especially walking.   Do exercises to strengthen the muscles you use for walking and lifting.   Do not smoke.   Minimize use of alcohol.  SEEK IMMEDIATE MEDICAL CARE IF:   You feel dizzy, weak, or unsteady on your feet.   You feel confused.   You fall.  Document Released: 11/04/2005 Document Revised: 07/17/2011 Document Reviewed: 05/01/2007 [employer] Patient Information 2012 [employer]. 

## 2012-01-07 NOTE — ED Provider Notes (Signed)
History     CSN: [id number]  Arrival date & time 01/07/12  2034   First MD Initiated Contact with Patient 01/07/12 2100      Chief Complaint  Patient presents with  . Fall    (Consider location/radiation/quality/duration/timing/severity/associated sxs/prior treatment) Patient is a 78 y.o. female presenting with fall. The history is provided by the nursing home, the EMS personnel and the patient. The history is limited by the condition of the patient.  Fall   patient presents after being found on the ground at the nursing home with an unwitnessed fall.. Patient does have a history of major and so it is unclear if she lost consciousness. Patient complains of pain to her right hip and face. Denies any back pain. She is unsure of if she had any symptoms prior to her fall or if she simply just slipped. EMS was called and patient placed on the backboard in C-spine and transported here  Past Medical History  Diagnosis Date  . Hypertension   . Hyperlipidemia   . Dementia   . Severe frontal headaches   . Hypothyroidism   . Reflux   . Mitral valve prolapse   . Hypothyroidism     History reviewed. No pertinent past surgical history.  History reviewed. No pertinent family history.  History  Substance Use Topics  . Smoking status: Never Smoker   . Smokeless tobacco: Not on file  . Alcohol Use: No    OB History    Grav Para Term Preterm Abortions TAB SAB Ect Mult Living                  Review of Systems  Unable to perform ROS   Allergies  Other and Penicillins  Home Medications   Current Outpatient Rx  Name Route Sig Dispense Refill  . ATENOLOL 50 MG PO TABS Oral Take 1 tablet (50 mg total) by mouth 2 (two) times daily. 180 tablet 3  . DIVALPROEX SODIUM ER 500 MG PO TB24 Oral Take 1 tablet (500 mg total) by mouth daily. 90 tablet 3  . FUROSEMIDE 20 MG PO TABS  Take 1 tablet on Monday, Wednesday, and Friday 30 tablet 3  . LEVOTHYROXINE SODIUM 150 MCG PO TABS  TAKE ONE  TABLET BY MOUTH DAILY 30 tablet 1    CALL OFFICE FOR APPOINTMENT, NO MORE REFILLS  . LOPERAMIDE HCL 2 MG PO CAPS Oral Take 2 mg by mouth as needed. Take one capsule by mouth for initial dose, then take one as needed for diarrhea.     . OMEPRAZOLE 20 MG PO CPDR  TAKE ONE CAPSULE BY MOUTH DAILY 30 capsule 1    CALL OFFICE FOR APPOINTMENT, NO MORE REFILLS UNTIL ...  . PAROXETINE HCL 20 MG PO TABS Oral Take 1 tablet (20 mg total) by mouth every morning. 90 tablet 3  . POTASSIUM CHLORIDE 10 MEQ PO TBCR Oral Take 10 mEq by mouth daily.      . TRAMADOL HCL 50 MG PO TABS Oral Take 50 mg by mouth every 6 (six) hours as needed. pain      BP 137/65  Pulse 89  Temp(Src) 98.6 F (37 C) (Oral)  Resp 20  SpO2 100%  Physical Exam  Nursing note and vitals reviewed. Constitutional: She appears well-developed and well-nourished.  Non-toxic appearance. No distress.  HENT:  Head: Normocephalic. Head is with abrasion and with contusion.       Patient contusions noted.  Eyes: Conjunctivae, EOM and lids are  normal. Pupils are equal, round, and reactive to light.  Neck: Normal range of motion. Neck supple. No tracheal deviation present. No mass present.  Cardiovascular: Normal rate, regular rhythm and normal heart sounds.  Exam reveals no gallop.   No murmur heard. Pulmonary/Chest: Effort normal and breath sounds normal. No stridor. No respiratory distress. She has no decreased breath sounds. She has no wheezes. She has no rhonchi. She has no rales.  Abdominal: Soft. Normal appearance and bowel sounds are normal. She exhibits no distension. There is no tenderness. There is no rebound and no CVA tenderness.  Musculoskeletal: Normal range of motion. She exhibits no edema and no tenderness.       Nontender along her cervical thoracic and lumbar spine  Patient with pain with movement of her right lower extremity, no shortening or rotation noted  Neurological: She is alert. She has normal strength. No cranial  nerve deficit or sensory deficit.  Skin: Skin is warm and dry. No abrasion and no rash noted.  Psychiatric: Her speech is normal. Her mood appears anxious. She is slowed.    ED Course  Procedures (including critical care time)   Labs Reviewed  CBC  DIFFERENTIAL  COMPREHENSIVE METABOLIC PANEL  URINALYSIS, ROUTINE W REFLEX MICROSCOPIC  URINE CULTURE   No results found.   No diagnosis found.    MDM  Patient's cervical spine was cleared after results of the x-rays are noted. Patient be discharged home. Labs noted no signs of acute emergent condition noted        Toy Baker, MD 01/07/12 2238

## 2012-01-07 NOTE — ED Notes (Signed)
Per EMS-states pt broke her right hip 1 week ago and was up ambulating with a cane this evening

## 2012-01-08 LAB — URINE CULTURE
Culture  Setup Time: 201302200139
Culture: NO GROWTH

## 2013-01-06 IMAGING — CR DG HIP W/ PELVIS BILAT
5 series · 5 of 5 positions shown · non-contrast
Comparison: CT abdomen and pelvis 06/12/2007 reviewed.

CLINICAL DATA: Fall.  Hip pain.

BILATERAL HIP WITH PELVIS - 4+ VIEW

[t pelvis ap]
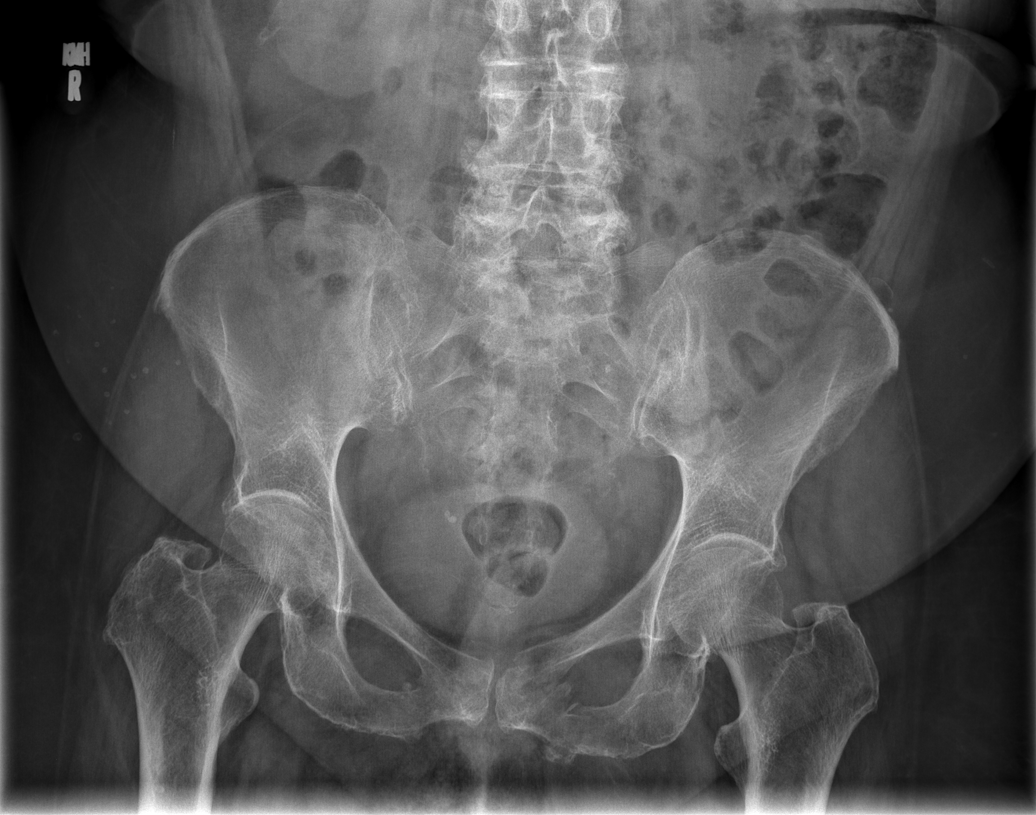

[t hip ap right]
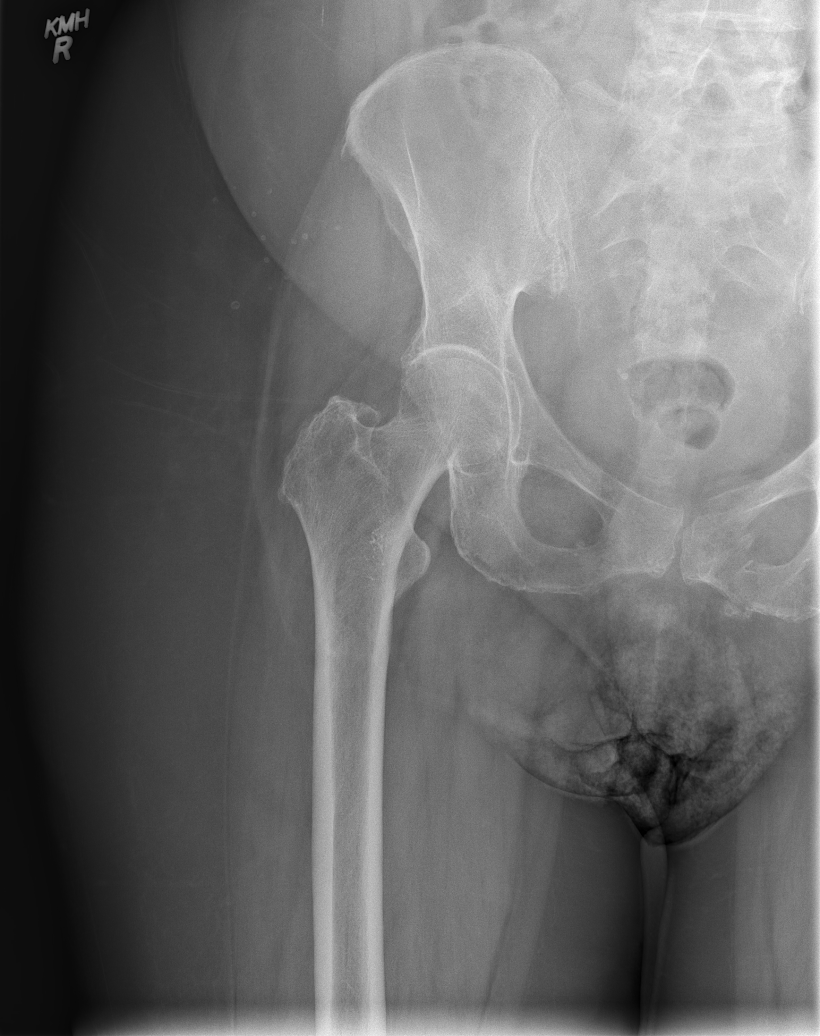

[t hip frog leg left]
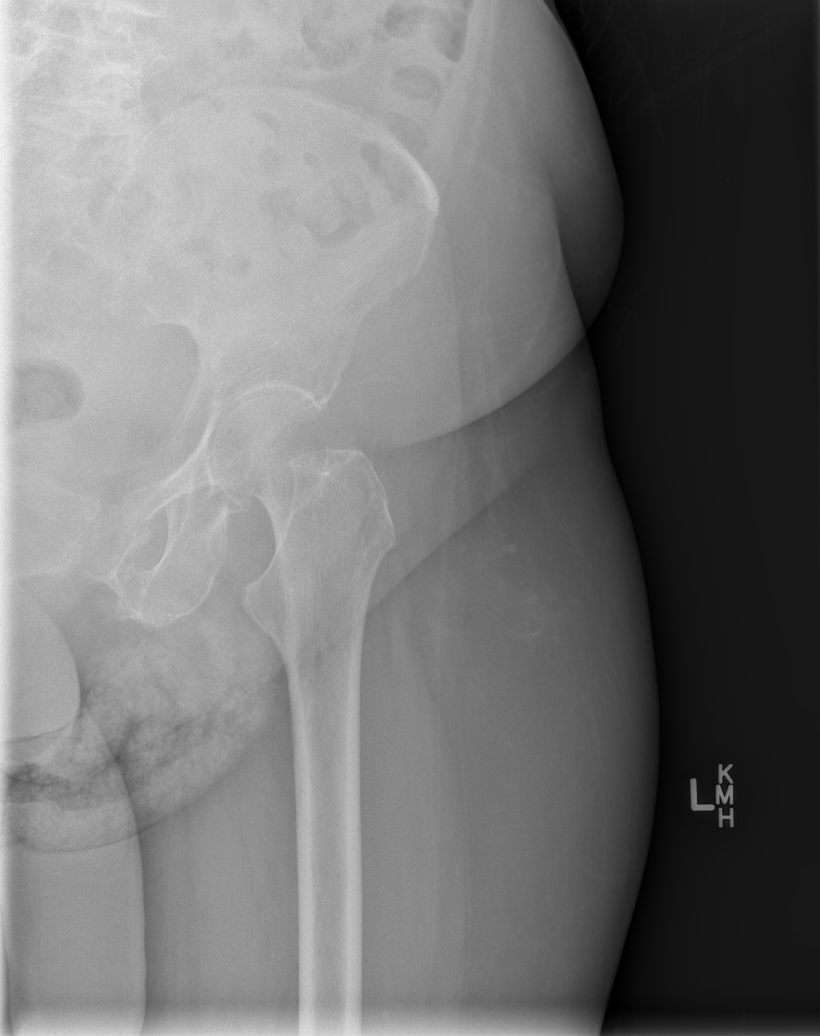

[w hip lat left]
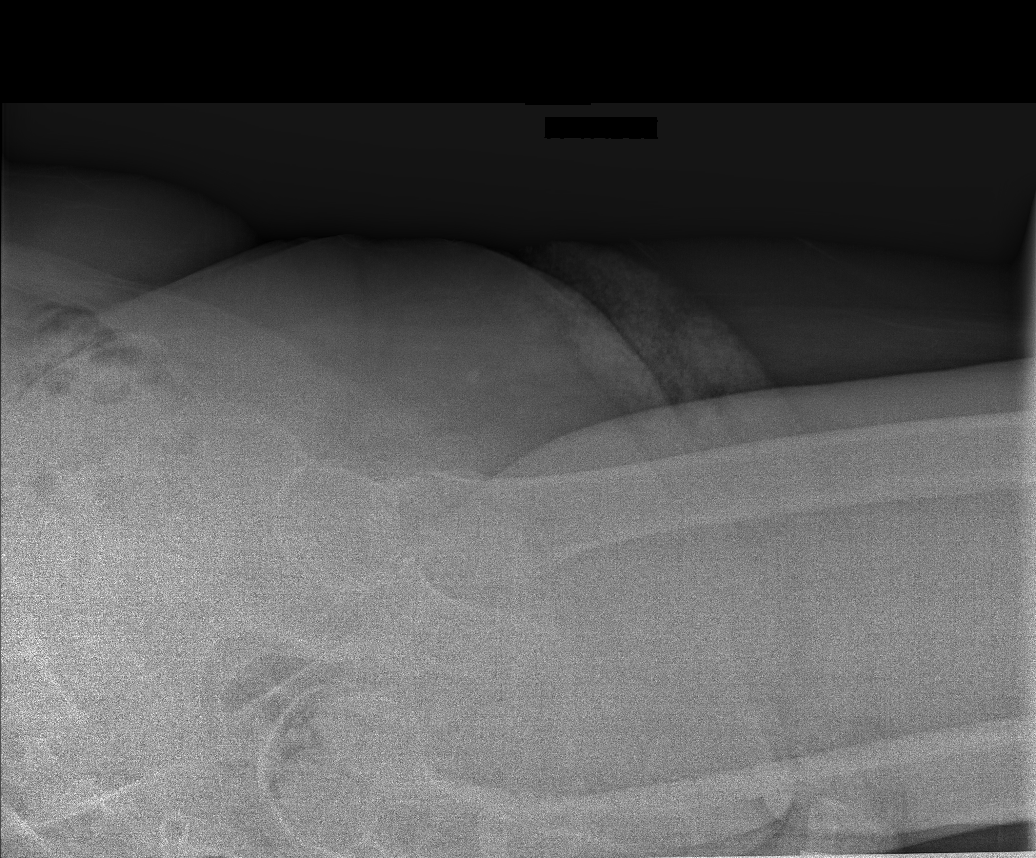

[w hip lat right]
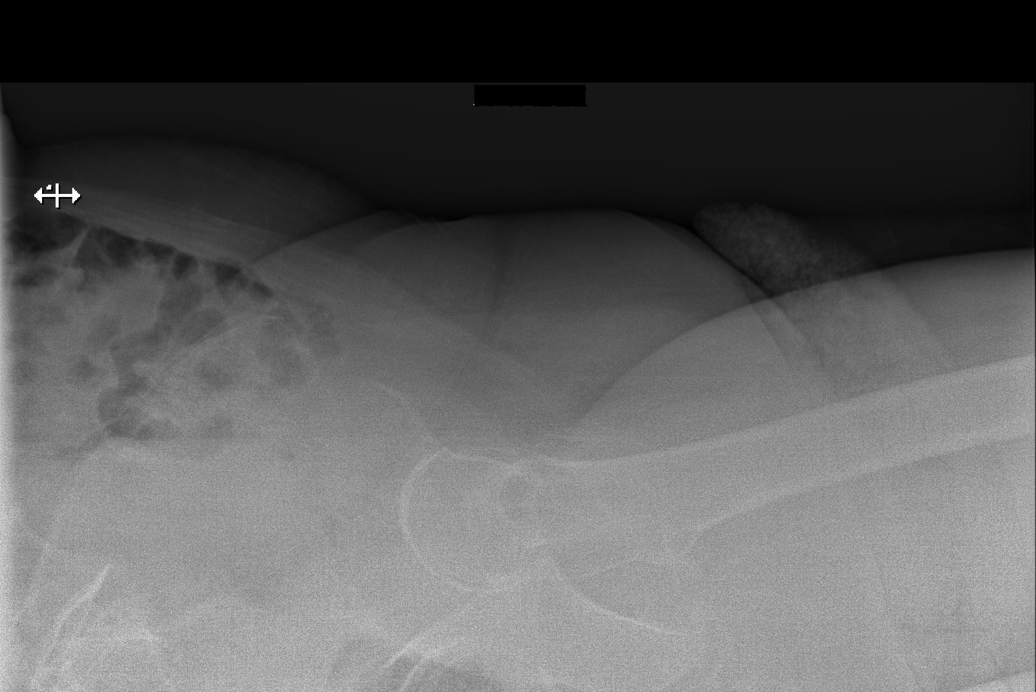

[5 of 5 positions shown; findings below may reference images not displayed]

FINDINGS: The patient has acute appearing left parasymphyseal pubic
bone fractures. No other fracture is identified.  The hips are
located.
IMPRESSION: Acute appearing left parasymphyseal pubic bone
fractures.

## 2013-01-06 IMAGING — CT CT HEAD W/O CM
3 of 4 series · 15 of 30 positions shown, 18 images · non-contrast
Comparison: 10/30/2011

CT HEAD

CLINICAL DATA: Fall.

CT HEAD WITHOUT CONTRAST
CT CERVICAL SPINE WITHOUT CONTRAST
TECHNIQUE: Multidetector CT imaging of the head and cervical spine
was performed following the standard protocol without intravenous
contrast.  Multiplanar CT image reconstructions of the cervical
spine were also generated.

[Series 3: c-spine st · axial · 0.25mm/px · z∈[-317,-297]mm · 2 of 94 slices shown]
[im 11/94  brain]
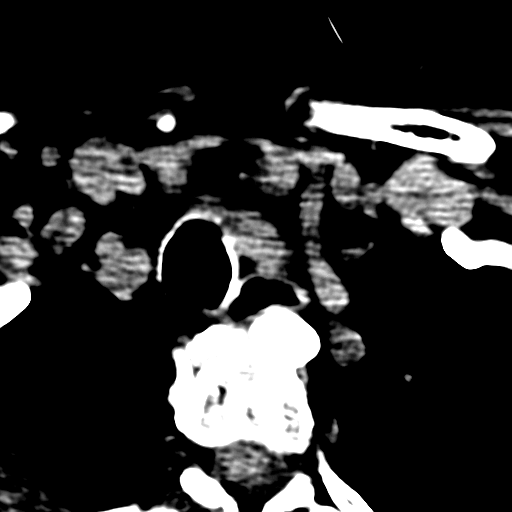
[im 21/94  brain]
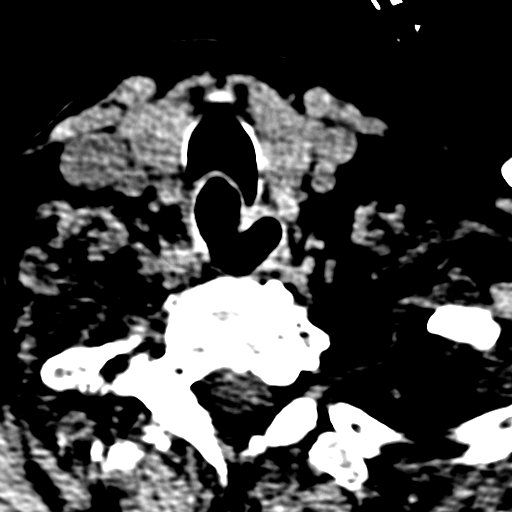

[Series 7: bone window · axial · 0.41mm/px · z∈[-152,-62]mm · 4 of 52 slices shown]
[im 11/52  bone]
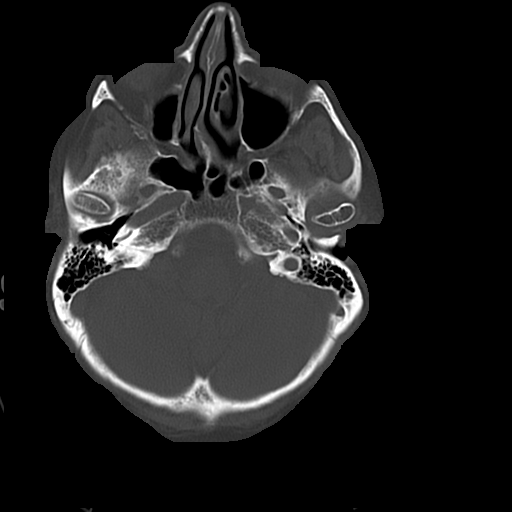
[im 21/52  bone]
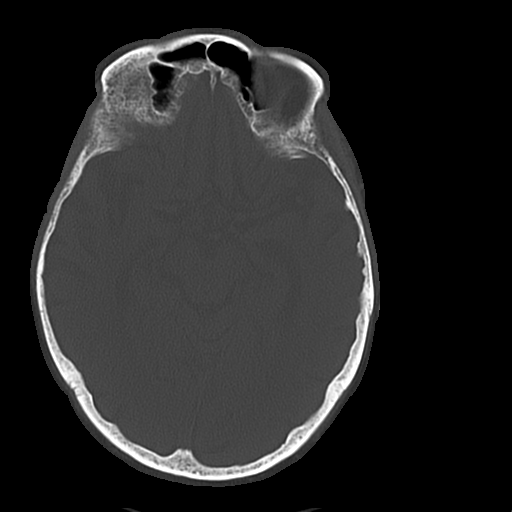
[im 31/52  bone]
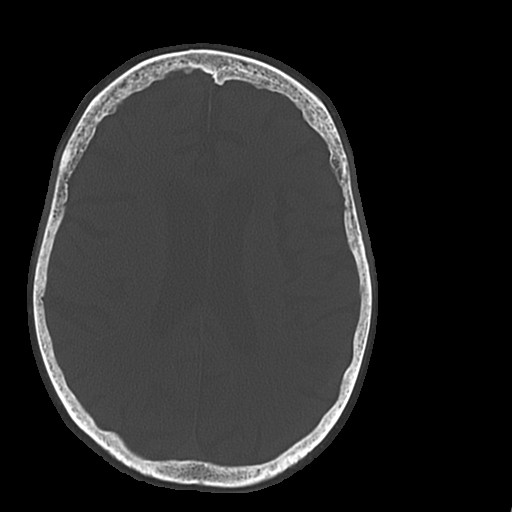
[im 41/52  bone]
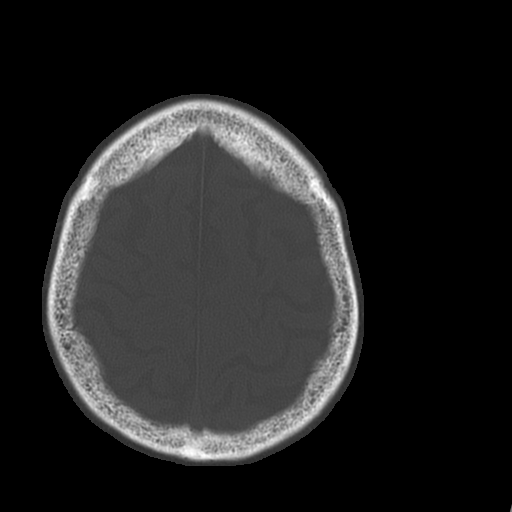

[Series 8: axial reformats · axial · 0.25mm/px · z∈[-355,-211]mm · 9 of 104 slices shown, 12 images]
[im 11/104  brain]
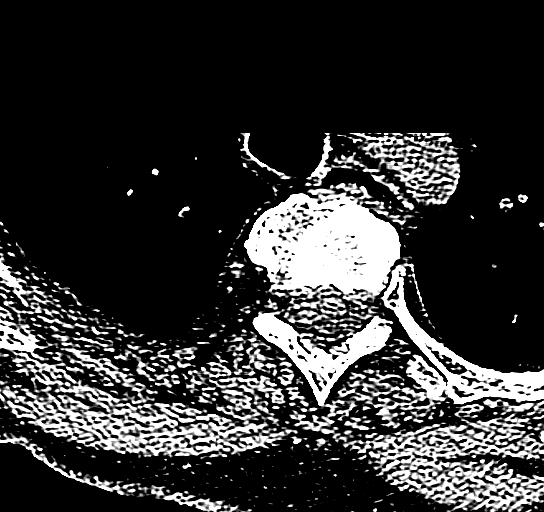
[im 11/104  bone]
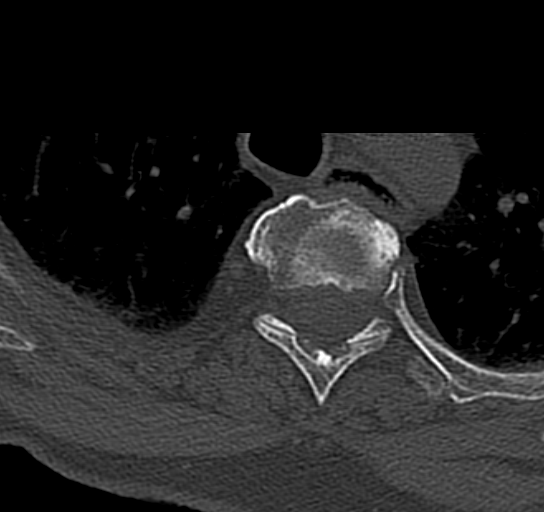
[im 21/104  brain]
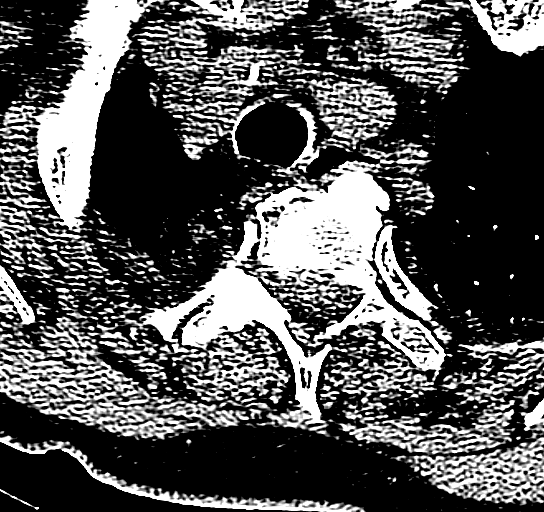
[im 31/104  brain]
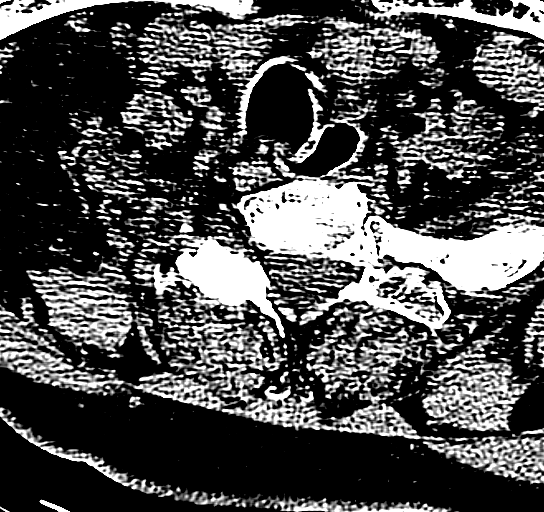
[im 42/104  brain]
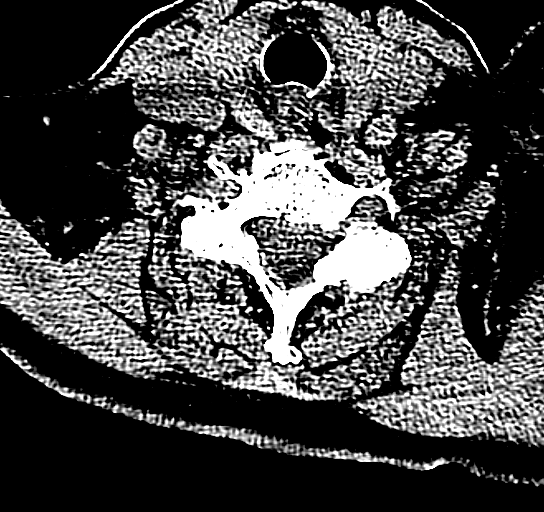
[im 52/104  brain]
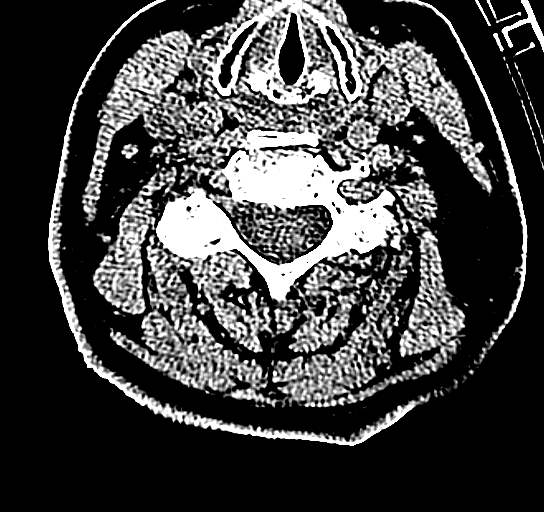
[im 52/104  bone]
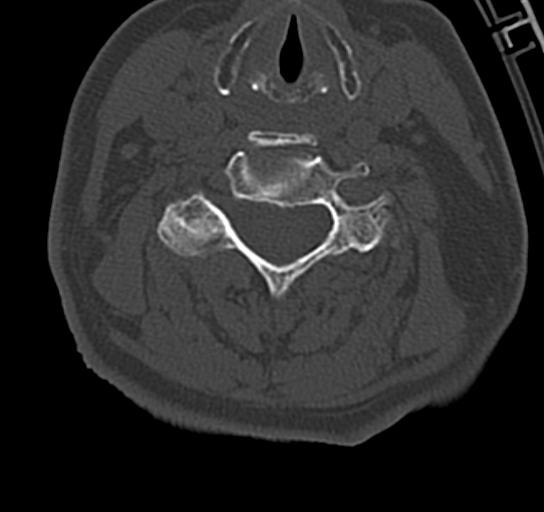
[im 62/104  brain]
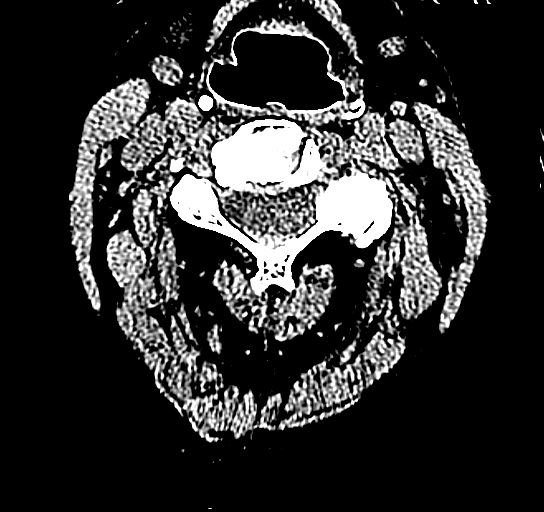
[im 73/104  brain]
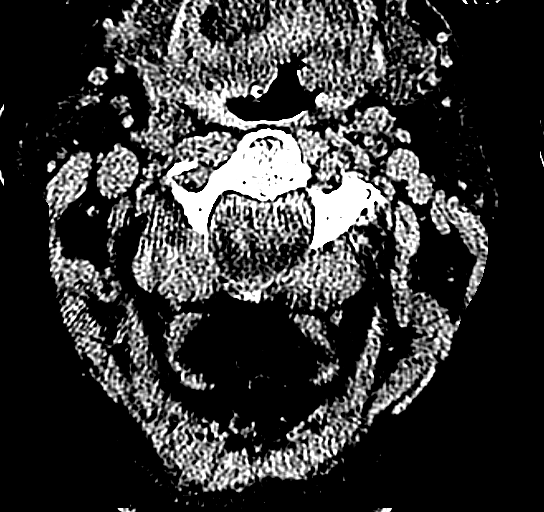
[im 83/104  brain]
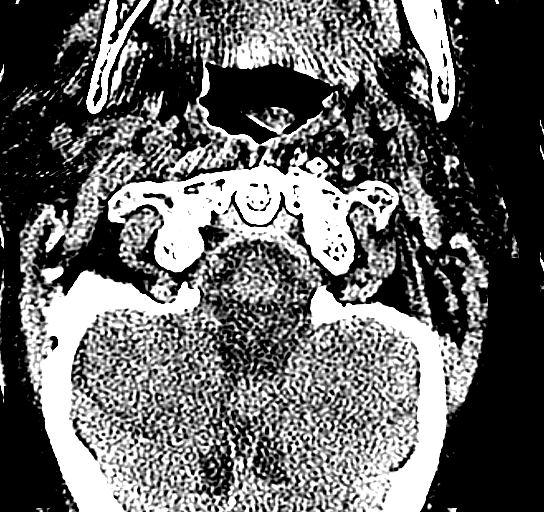
[im 93/104  brain]
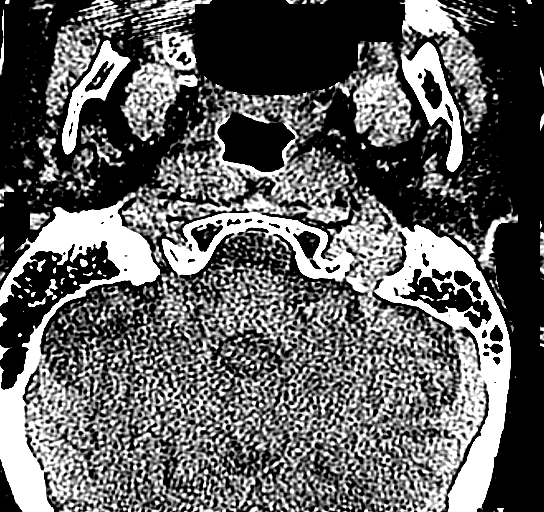
[im 93/104  bone]
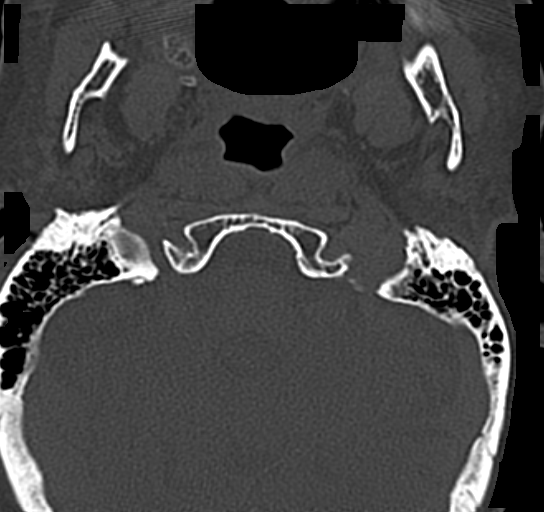

[15 of 30 positions shown; findings below may reference images not displayed]

FINDINGS: There is diffuse patchy low density throughout the
subcortical and periventricular white matter consistent with
chronic small vessel ischemic change.

There is prominence of the sulci and ventricles consistent with
brain atrophy.

There is no evidence for acute brain infarct, hemorrhage or mass.

The paranasal sinuses are clear.  The mastoid air cells are clear.

The skull appears intact.
IMPRESSION: 1.  Small vessel ischemic change and brain atrophy.
2.  No acute intracranial abnormalities.

CT CERVICAL SPINE
FINDINGS: Normal alignment of the cervical spine.  The vertebral
body heights are preserved.  Facet joints appear aligned.
Multilevel disc space narrowing and ventral spurring is noted.
There is no fracture or subluxation identified.
IMPRESSION: 1.  Cervical spondylosis.
2.  No fractures or subluxations.

## 2015-06-13 ENCOUNTER — Encounter (HOSPITAL_BASED_OUTPATIENT_CLINIC_OR_DEPARTMENT_OTHER): Payer: PRIVATE HEALTH INSURANCE | Attending: General Surgery

## 2015-10-02 ENCOUNTER — Encounter (HOSPITAL_BASED_OUTPATIENT_CLINIC_OR_DEPARTMENT_OTHER): Payer: Medicare Other | Attending: Plastic Surgery

## 2015-10-02 DIAGNOSIS — I129 Hypertensive chronic kidney disease with stage 1 through stage 4 chronic kidney disease, or unspecified chronic kidney disease: Secondary | ICD-10-CM | POA: Diagnosis not present

## 2015-10-02 DIAGNOSIS — F0280 Dementia in other diseases classified elsewhere without behavioral disturbance: Secondary | ICD-10-CM | POA: Diagnosis not present

## 2015-10-02 DIAGNOSIS — G309 Alzheimer's disease, unspecified: Secondary | ICD-10-CM | POA: Diagnosis not present

## 2015-10-02 DIAGNOSIS — L89622 Pressure ulcer of left heel, stage 2: Secondary | ICD-10-CM | POA: Diagnosis not present

## 2015-10-02 DIAGNOSIS — N189 Chronic kidney disease, unspecified: Secondary | ICD-10-CM | POA: Diagnosis not present

## 2015-10-02 DIAGNOSIS — E039 Hypothyroidism, unspecified: Secondary | ICD-10-CM | POA: Diagnosis not present

## 2015-10-31 ENCOUNTER — Encounter (HOSPITAL_BASED_OUTPATIENT_CLINIC_OR_DEPARTMENT_OTHER): Payer: Medicare Other | Attending: General Surgery

## 2015-10-31 DIAGNOSIS — L89622 Pressure ulcer of left heel, stage 2: Secondary | ICD-10-CM | POA: Insufficient documentation

## 2015-10-31 DIAGNOSIS — N189 Chronic kidney disease, unspecified: Secondary | ICD-10-CM | POA: Insufficient documentation

## 2015-10-31 DIAGNOSIS — I129 Hypertensive chronic kidney disease with stage 1 through stage 4 chronic kidney disease, or unspecified chronic kidney disease: Secondary | ICD-10-CM | POA: Insufficient documentation

## 2015-10-31 DIAGNOSIS — E079 Disorder of thyroid, unspecified: Secondary | ICD-10-CM | POA: Insufficient documentation

## 2015-10-31 DIAGNOSIS — F0280 Dementia in other diseases classified elsewhere without behavioral disturbance: Secondary | ICD-10-CM | POA: Insufficient documentation

## 2015-10-31 DIAGNOSIS — G309 Alzheimer's disease, unspecified: Secondary | ICD-10-CM | POA: Insufficient documentation

## 2015-11-07 DIAGNOSIS — E079 Disorder of thyroid, unspecified: Secondary | ICD-10-CM | POA: Diagnosis not present

## 2015-11-07 DIAGNOSIS — I129 Hypertensive chronic kidney disease with stage 1 through stage 4 chronic kidney disease, or unspecified chronic kidney disease: Secondary | ICD-10-CM | POA: Diagnosis not present

## 2015-11-07 DIAGNOSIS — L89622 Pressure ulcer of left heel, stage 2: Secondary | ICD-10-CM | POA: Diagnosis not present

## 2015-11-07 DIAGNOSIS — G309 Alzheimer's disease, unspecified: Secondary | ICD-10-CM | POA: Diagnosis not present

## 2015-11-07 DIAGNOSIS — F0280 Dementia in other diseases classified elsewhere without behavioral disturbance: Secondary | ICD-10-CM | POA: Diagnosis not present

## 2015-11-07 DIAGNOSIS — N189 Chronic kidney disease, unspecified: Secondary | ICD-10-CM | POA: Diagnosis not present

## 2015-12-05 ENCOUNTER — Encounter (HOSPITAL_BASED_OUTPATIENT_CLINIC_OR_DEPARTMENT_OTHER): Payer: Medicare Other | Attending: Internal Medicine

## 2015-12-05 DIAGNOSIS — I129 Hypertensive chronic kidney disease with stage 1 through stage 4 chronic kidney disease, or unspecified chronic kidney disease: Secondary | ICD-10-CM | POA: Insufficient documentation

## 2015-12-05 DIAGNOSIS — E079 Disorder of thyroid, unspecified: Secondary | ICD-10-CM | POA: Insufficient documentation

## 2015-12-05 DIAGNOSIS — F0280 Dementia in other diseases classified elsewhere without behavioral disturbance: Secondary | ICD-10-CM | POA: Insufficient documentation

## 2015-12-05 DIAGNOSIS — G309 Alzheimer's disease, unspecified: Secondary | ICD-10-CM | POA: Insufficient documentation

## 2015-12-05 DIAGNOSIS — N189 Chronic kidney disease, unspecified: Secondary | ICD-10-CM | POA: Insufficient documentation

## 2015-12-05 DIAGNOSIS — L89622 Pressure ulcer of left heel, stage 2: Secondary | ICD-10-CM | POA: Insufficient documentation

## 2015-12-16 ENCOUNTER — Encounter (HOSPITAL_COMMUNITY): Payer: Self-pay | Admitting: Emergency Medicine

## 2015-12-16 ENCOUNTER — Emergency Department (HOSPITAL_COMMUNITY)
Admission: EM | Admit: 2015-12-16 | Discharge: 2015-12-16 | Disposition: A | Discharge status: Home / Self Care | Payer: Medicare Other | Attending: Emergency Medicine | Admitting: Emergency Medicine

## 2015-12-16 DIAGNOSIS — Z7982 Long term (current) use of aspirin: Secondary | ICD-10-CM | POA: Insufficient documentation

## 2015-12-16 DIAGNOSIS — I341 Nonrheumatic mitral (valve) prolapse: Secondary | ICD-10-CM | POA: Insufficient documentation

## 2015-12-16 DIAGNOSIS — Z79899 Other long term (current) drug therapy: Secondary | ICD-10-CM | POA: Diagnosis not present

## 2015-12-16 DIAGNOSIS — Y998 Other external cause status: Secondary | ICD-10-CM | POA: Insufficient documentation

## 2015-12-16 DIAGNOSIS — X58XXXA Exposure to other specified factors, initial encounter: Secondary | ICD-10-CM | POA: Insufficient documentation

## 2015-12-16 DIAGNOSIS — Y9289 Other specified places as the place of occurrence of the external cause: Secondary | ICD-10-CM | POA: Diagnosis not present

## 2015-12-16 DIAGNOSIS — Z880 Allergy status to penicillin: Secondary | ICD-10-CM | POA: Diagnosis not present

## 2015-12-16 DIAGNOSIS — Y9389 Activity, other specified: Secondary | ICD-10-CM | POA: Diagnosis not present

## 2015-12-16 DIAGNOSIS — Z8719 Personal history of other diseases of the digestive system: Secondary | ICD-10-CM | POA: Insufficient documentation

## 2015-12-16 DIAGNOSIS — I10 Essential (primary) hypertension: Secondary | ICD-10-CM | POA: Insufficient documentation

## 2015-12-16 DIAGNOSIS — E039 Hypothyroidism, unspecified: Secondary | ICD-10-CM | POA: Insufficient documentation

## 2015-12-16 DIAGNOSIS — F0390 Unspecified dementia without behavioral disturbance: Secondary | ICD-10-CM | POA: Diagnosis not present

## 2015-12-16 DIAGNOSIS — H1131 Conjunctival hemorrhage, right eye: Secondary | ICD-10-CM | POA: Diagnosis not present

## 2015-12-16 DIAGNOSIS — H578 Other specified disorders of eye and adnexa: Secondary | ICD-10-CM | POA: Diagnosis present

## 2015-12-16 DIAGNOSIS — S0501XA Injury of conjunctiva and corneal abrasion without foreign body, right eye, initial encounter: Secondary | ICD-10-CM | POA: Diagnosis not present

## 2015-12-16 MED ORDER — ERYTHROMYCIN 5 MG/GM OP OINT: TOPICAL_OINTMENT | OPHTHALMIC | Status: AC

## 2015-12-16 MED ORDER — FLUORESCEIN SODIUM 1 MG OP STRP
1.0000 | ORAL_STRIP | Freq: Once | OPHTHALMIC | Status: AC
  Administered 2015-12-16: 1 via OPHTHALMIC
  Filled 2015-12-16: qty 1

## 2015-12-16 NOTE — ED Notes (Signed)
Bed: WA21 Expected date: 12/16/15 Expected time: 10:17 AM Means of arrival: Ambulance Comments: Elderly, "blood in rt eye"

## 2015-12-16 NOTE — ED Notes (Signed)
PA notified that strip and lamp are at the bedside

## 2015-12-16 NOTE — ED Notes (Signed)
Pt withdrawn actions and fidgeting causes difficulty in assessing and obtaining vs.. Pt vitals stable at this time. Pt is unable to answer questions d/t dementia and just sts "he". Pt in NAD and VSS

## 2015-12-16 NOTE — ED Provider Notes (Signed)
CSN: [id number]     Arrival date & time 12/16/15  1020 History   First MD Initiated Contact with Patient 12/16/15 1028     Chief Complaint  Patient presents with  . Eye Problem     (Consider location/radiation/quality/duration/timing/severity/associated sxs/prior Treatment) The history is provided by medical records.    [name] is a 82 y.o. female  with a PMH of dementia, HTN, HLD, hypothyroidism who presents to the Emergency Department from nursing home for blood in right eye. Per nursing report, when staff gave patient breakfast her eye was fine. When they came back to take tray, they noticed her eye was very red. No fall or injury that staff is aware of. Sent over for further eval. Patient is at baseline mental status - alert and oriented to self only.  Level 5 Caveat applies due to dementia.   Past Medical History  Diagnosis Date  . Hypertension   . Hyperlipidemia   . Dementia   . Severe frontal headaches   . Hypothyroidism   . Reflux   . Mitral valve prolapse   . Hypothyroidism    History reviewed. No pertinent past surgical history. No family history on file. Social History  Substance Use Topics  . Smoking status: Never Smoker   . Smokeless tobacco: None  . Alcohol Use: No   OB History    No data available     Review of Systems  Unable to perform ROS: Dementia      Allergies  Other and Penicillins  Home Medications   Prior to Admission medications   Medication Sig Start Date End Date Taking? Authorizing Provider  aspirin 81 MG chewable tablet Chew 81 mg by mouth daily.   Yes Historical Provider, MD  atenolol (TENORMIN) 25 MG tablet Take 25 mg by mouth 2 (two) times daily.   Yes Historical Provider, MD  cholecalciferol (VITAMIN D) 400 units TABS tablet Take 400 Units by mouth daily.   Yes Historical Provider, MD  furosemide (LASIX) 20 MG tablet Take 1 tablet on Monday, Wednesday, and Friday Patient taking differently: Take 20 mg by mouth daily.   03/13/11  Yes Cassell Clement, MD  levothyroxine (SYNTHROID, LEVOTHROID) 112 MCG tablet Take 112 mcg by mouth daily before breakfast.   Yes Historical Provider, MD  [facility] Apply 1 application topically daily. Apply to legs daily   Yes Historical Provider, MD  Nutritional Supplements (NUTRITIONAL DRINK PO) Take 1 Container by mouth 3 (three) times daily. Mighty Shakes   Yes Historical Provider, MD  Ostomy Supplies (SKIN PREP SPRAY) MISC Apply 1 application topically 3 (three) times daily. Apply to right inner heel three times daily   Yes Historical Provider, MD  potassium chloride (KLOR-CON) 10 MEQ CR tablet Take 10 mEq by mouth 3 (three) times a week. On Mondays, Wednesday, and Fridays   Yes Historical Provider, MD  Zinc Oxide (SKIN PROTECTANT EX) Apply 1 application topically 3 (three) times daily as needed (for reddened area around vaginal area.). Apply three times daily starting 12/16/15 until healed and after may use as needed for redness around vaginal area   Yes Historical Provider, MD  ZINC OXIDE, TOPICAL, (SECURA PROTECTIVE) 10 % CREA Apply 1 application topically 3 (three) times daily. Apply to [employer]um every shift (TID)   Yes Historical Provider, MD  atenolol (TENORMIN) 50 MG tablet Take 1 tablet (50 mg total) by mouth 2 (two) times daily. Patient not taking: Reported on 12/16/2015 04/22/11   Cassell Clement, MD  divalproex (DEPAKOTE ER) 500 MG 24 hr tablet Take 1 tablet (500 mg total) by mouth daily. 04/22/11 04/21/12  Cassell Clement, MD  erythromycin ophthalmic ointment Place a 1/2 inch ribbon of ointment into the lower eyelid four times daily for five days. 12/16/15   Chase Picket Ward, PA-C  levothyroxine (SYNTHROID, LEVOTHROID) 150 MCG tablet TAKE ONE TABLET BY MOUTH DAILY Patient not taking: Reported on 12/16/2015 08/20/11   Cassell Clement, MD  omeprazole (PRILOSEC) 20 MG capsule TAKE ONE CAPSULE BY MOUTH DAILY Patient not taking: Reported on 12/16/2015 08/20/11   Cassell Clement, MD   PARoxetine (PAXIL) 20 MG tablet Take 1 tablet (20 mg total) by mouth every morning. Patient not taking: Reported on 12/16/2015 04/22/11   Cassell Clement, MD   BP 105/89 mmHg  Pulse 62  Temp(Src) 97.6 F (36.4 C) (Axillary)  Resp 20  SpO2 97% Physical Exam  Constitutional: She appears well-developed and well-nourished.  NAD  HENT:  Head: Normocephalic and atraumatic.  Eyes: Lids are normal. Lids are everted and swept, no foreign bodies found.    Collection of blood c/w subconjunctival bleeding. No blood over iris or pupil. EOMI, PERRL Staining showed abrasion as depicted in image. Eye flushed, no foreign bodies appreciated.   Cardiovascular: Normal rate and regular rhythm.   Pulmonary/Chest: Effort normal and breath sounds normal. No respiratory distress. She has no wheezes. She has no rales.  Abdominal: Soft. She exhibits no distension and no mass. There is no tenderness. There is no rebound and no guarding.  Musculoskeletal: She exhibits no edema.  Neurological:  Alert to self only. Able to follow some commands. Does not respond to questions.   Skin: Skin is warm and dry.  Nursing note and vitals reviewed.   ED Course  Procedures (including critical care time) Labs Review Labs Reviewed - No data to display  Imaging Review No results found. I have personally reviewed and evaluated these images and lab results as part of my medical decision-making.   EKG Interpretation None      MDM   Final diagnoses:  Corneal abrasion, right, initial encounter  Subconjunctival bleed, right   Pt with right subconjunctival hemorrhage and corneal abrasion on exam. No evidence of FB. EOMI. PERRL. Exam not concerning for orbital cellulitis. No concern for uveitis. Patient will be discharged home with erythromycin ointment. Spoke with patient's son, [name], who is aware that patient needs to see the eye doctor for follow up appointment. Will include this information on discharge  paperwork for facility as well; return precautions in discharge instructions. Patient appears safe for discharged.   [facility] Ward, PA-C 12/16/15 1210  Tilden Fossa, MD 12/17/15 [phone]

## 2015-12-16 NOTE — ED Notes (Signed)
[facility] called to transport pt back to [facility]

## 2015-12-16 NOTE — ED Notes (Signed)
PTAR arrived to transport pt. 

## 2015-12-16 NOTE — Discharge Instructions (Signed)
YOU MUST GO SEE THE EYE DOCTOR. Please call first thing Monday morning and make an appointment. I would like you to be seen on Monday or Tuesday if possible.     Medications: Erythromycin ointment four times a day for five days in the affected eye, continue usual home medications Please return to the ER for changes in vision, new or worsening symptoms, any additional concerns.

## 2015-12-16 NOTE — ED Notes (Addendum)
Pt from [facility] (or [facility], EMS did not know), off [facility] via EMS-Per EMS, staff reports that pt was beng fed, eyes were normal and after eating pt had what appeared to be blood in R eye. Staff denies trauma or fall. Pt has hx of dementia and is poor historian. Pt is alert to self only which is her normal baseline. Pt in NAD.

## 2015-12-19 DIAGNOSIS — F0280 Dementia in other diseases classified elsewhere without behavioral disturbance: Secondary | ICD-10-CM | POA: Diagnosis not present

## 2015-12-19 DIAGNOSIS — E079 Disorder of thyroid, unspecified: Secondary | ICD-10-CM | POA: Diagnosis not present

## 2015-12-19 DIAGNOSIS — G309 Alzheimer's disease, unspecified: Secondary | ICD-10-CM | POA: Diagnosis not present

## 2015-12-19 DIAGNOSIS — L89622 Pressure ulcer of left heel, stage 2: Secondary | ICD-10-CM | POA: Diagnosis present

## 2015-12-19 DIAGNOSIS — N189 Chronic kidney disease, unspecified: Secondary | ICD-10-CM | POA: Diagnosis not present

## 2015-12-19 DIAGNOSIS — I129 Hypertensive chronic kidney disease with stage 1 through stage 4 chronic kidney disease, or unspecified chronic kidney disease: Secondary | ICD-10-CM | POA: Diagnosis not present

## 2016-06-16 ENCOUNTER — Encounter (HOSPITAL_COMMUNITY): Payer: Self-pay

## 2016-06-16 ENCOUNTER — Emergency Department (HOSPITAL_COMMUNITY): Payer: Medicare Other

## 2016-06-16 ENCOUNTER — Emergency Department (HOSPITAL_COMMUNITY)
Admission: EM | Admit: 2016-06-16 | Discharge: 2016-06-16 | Disposition: A | Discharge status: Home / Self Care | Payer: Medicare Other | Attending: Emergency Medicine | Admitting: Emergency Medicine

## 2016-06-16 DIAGNOSIS — S0990XA Unspecified injury of head, initial encounter: Secondary | ICD-10-CM | POA: Diagnosis present

## 2016-06-16 DIAGNOSIS — Y929 Unspecified place or not applicable: Secondary | ICD-10-CM | POA: Insufficient documentation

## 2016-06-16 DIAGNOSIS — S0003XA Contusion of scalp, initial encounter: Secondary | ICD-10-CM | POA: Diagnosis not present

## 2016-06-16 DIAGNOSIS — W06XXXA Fall from bed, initial encounter: Secondary | ICD-10-CM | POA: Insufficient documentation

## 2016-06-16 DIAGNOSIS — W19XXXA Unspecified fall, initial encounter: Secondary | ICD-10-CM

## 2016-06-16 DIAGNOSIS — I10 Essential (primary) hypertension: Secondary | ICD-10-CM | POA: Insufficient documentation

## 2016-06-16 DIAGNOSIS — E039 Hypothyroidism, unspecified: Secondary | ICD-10-CM | POA: Diagnosis not present

## 2016-06-16 DIAGNOSIS — Z7982 Long term (current) use of aspirin: Secondary | ICD-10-CM | POA: Insufficient documentation

## 2016-06-16 DIAGNOSIS — Y999 Unspecified external cause status: Secondary | ICD-10-CM | POA: Insufficient documentation

## 2016-06-16 DIAGNOSIS — Y939 Activity, unspecified: Secondary | ICD-10-CM | POA: Insufficient documentation

## 2016-06-16 DIAGNOSIS — Z79899 Other long term (current) drug therapy: Secondary | ICD-10-CM | POA: Insufficient documentation

## 2016-06-16 DIAGNOSIS — T148XXA Other injury of unspecified body region, initial encounter: Secondary | ICD-10-CM

## 2016-06-16 NOTE — ED Notes (Signed)
Bed: RZ73 Expected date:  Expected time:  Means of arrival:  Comments: 77 F fall

## 2016-06-16 NOTE — ED Notes (Signed)
No respiratory or acute distress noted alert but confused call light in reach awaiting [facility] to take pt back to [facility].

## 2016-06-16 NOTE — ED Notes (Signed)
Attempted to call report to [facility] [phone] unable to reach anyone after 2 attempts.

## 2016-06-16 NOTE — ED Notes (Signed)
[facility] notified of need for transport of pt back to residence.  

## 2016-06-16 NOTE — ED Notes (Signed)
Patient transported to CT 

## 2016-06-16 NOTE — ED Provider Notes (Signed)
[facility]-EMERGENCY DEPT Provider Note  By signing my name below, I, [name], attest that this documentation has been prepared under the direction and in the presence of Treatment Team:  Attending Provider: Gilda Crease, MD.  Electronically Signed: Arvilla Warner, Medical Scribe. 06/16/16. 3:44 AM.  CSN: [id number] Arrival date & time: 06/16/16  0330  First Provider Contact:  None    History   Chief Complaint Chief Complaint  Patient presents with  . Fall    fell out of bed on pad at [facility] no physical injury    HPI Comments: [name] is a 82 y.o. female who presents to the Emergency Department complaining of fall onset tonight. Pt denies being in pain.  The history is provided by the patient. No language interpreter was used.    Past Medical History:  Diagnosis Date  . Dementia   . Hyperlipidemia   . Hypertension   . Hypothyroidism   . Hypothyroidism   . Mitral valve prolapse   . Reflux   . Severe frontal headaches     Patient Active Problem List   Diagnosis Date Noted  . Hypertension   . Hyperlipidemia   . Dementia   . Severe frontal headaches   . Hypothyroidism   . Reflux   . Mitral valve prolapse     History reviewed. No pertinent surgical history.  OB History    No data available       Home Medications    Prior to Admission medications   Medication Sig Start Date End Date Taking? Authorizing Provider  aspirin 81 MG chewable tablet Chew 81 mg by mouth daily.   Yes Historical Provider, MD  atenolol (TENORMIN) 25 MG tablet Take 25 mg by mouth 2 (two) times daily.   Yes Historical Provider, MD  furosemide (LASIX) 20 MG tablet Take 1 tablet on Monday, Wednesday, and Friday Patient taking differently: Take 10 mg by mouth daily.  03/13/11  Yes Cassell Clement, MD  levothyroxine (SYNTHROID, LEVOTHROID) 112 MCG tablet Take 112 mcg by mouth daily before breakfast.   Yes Historical Provider, MD  Nutritional Supplements (NUTRITIONAL  DRINK PO) Take 4 oz by mouth 3 (three) times daily. Mighty Shakes    Yes Historical Provider, MD  Ostomy Supplies (SKIN PREP SPRAY) MISC Apply 1 application topically 3 (three) times daily. Apply to right inner heel three times daily   Yes Historical Provider, MD  potassium chloride (K-DUR,KLOR-CON) 10 MEQ tablet Take 10 mEq by mouth every Monday, Wednesday, and Friday. 05/29/16  Yes Historical Provider, MD  ZINC OXIDE, TOPICAL, (SECURA PROTECTIVE) 10 % CREA Apply 1 application topically 3 (three) times daily. Apply three times daily during every shift--apply to sacrum.   Yes Historical Provider, MD  atenolol (TENORMIN) 50 MG tablet Take 1 tablet (50 mg total) by mouth 2 (two) times daily. Patient not taking: Reported on 12/16/2015 04/22/11   Cassell Clement, MD  divalproex (DEPAKOTE ER) 500 MG 24 hr tablet Take 1 tablet (500 mg total) by mouth daily. Patient not taking: Reported on 06/16/2016 04/22/11 04/21/12  Cassell Clement, MD  erythromycin ophthalmic ointment Place a 1/2 inch ribbon of ointment into the lower eyelid four times daily for five days. Patient not taking: Reported on 06/16/2016 12/16/15   [facility] Ward, PA-C  levothyroxine (SYNTHROID, LEVOTHROID) 150 MCG tablet TAKE ONE TABLET BY MOUTH DAILY Patient not taking: Reported on 12/16/2015 08/20/11   Cassell Clement, MD  omeprazole (PRILOSEC) 20 MG capsule TAKE ONE CAPSULE BY MOUTH DAILY  Patient not taking: Reported on 12/16/2015 08/20/11   Cassell Clement, MD  PARoxetine (PAXIL) 20 MG tablet Take 1 tablet (20 mg total) by mouth every morning. Patient not taking: Reported on 12/16/2015 04/22/11   Cassell Clement, MD    Family History History reviewed. No pertinent family history.  Social History Social History  Substance Use Topics  . Smoking status: Never Smoker  . Smokeless tobacco: Never Used  . Alcohol use No   Allergies   Other and Penicillins  Review of Systems Review of Systems  Musculoskeletal: Negative for back pain and  neck pain.   Physical Exam Updated Vital Signs BP 130/62   Pulse 62   Temp (!) 96.8 F (36 C) (Axillary)   Resp 17   Ht 5\' 5"  (1.651 m)   Wt 150 lb (68 kg)   SpO2 96%   BMI 24.96 kg/m   Physical Exam  Constitutional: She appears well-developed and well-nourished. No distress.  HENT:  Head: Normocephalic and atraumatic.  Right Ear: Hearing normal.  Left Ear: Hearing normal.  Nose: Nose normal.  Mouth/Throat: Oropharynx is clear and moist and mucous membranes are normal.  Eyes: Conjunctivae and EOM are normal. Pupils are equal, round, and reactive to light.  Neck: Normal range of motion. Neck supple.  Cardiovascular: Regular rhythm, S1 normal and S2 normal.  Exam reveals no gallop and no friction rub.   No murmur heard. Pulmonary/Chest: Effort normal and breath sounds normal. No respiratory distress. She exhibits no tenderness.  Abdominal: Soft. Normal appearance and bowel sounds are normal. There is no hepatosplenomegaly. There is no tenderness. There is no rebound, no guarding, no tenderness at McBurney's point and negative Murphy's sign. No hernia.  Musculoskeletal: Normal range of motion.  Neurological: She has normal strength. No cranial nerve deficit or sensory deficit. Coordination normal. GCS eye subscore is 4. GCS verbal subscore is 5. GCS motor subscore is 6.  Not alert or reactive  Skin: Skin is warm, dry and intact. No rash noted. No cyanosis.  ecchymotic area on side of her scalp  Psychiatric: She has a normal mood and affect. Her speech is normal and behavior is normal. Thought content normal.  Nursing note and vitals reviewed.   ED Treatments / Results  Labs (all labs ordered are listed, but only abnormal results are displayed) Labs Reviewed - No data to display  DIAGNOSTIC STUDIES: Oxygen Saturation is 96% on RA, nl by my interpretation.    COORDINATION OF CARE: 5:01 AM Discussed treatment plan with pt at bedside and pt agreed to plan.  EKG  EKG  Interpretation None      Radiology Dg Chest 1 View  Result Date: 06/16/2016 CLINICAL DATA:  82 year old female with fall EXAM: CHEST 1 VIEW COMPARISON:  Chest radiograph dated 11/08/2011 FINDINGS: Single portable view of the chest demonstrates emphysematous changes of the lungs. There is no focal consolidation, pleural effusion, or pneumothorax. Mild enlargement of the cardiac silhouette. There is advanced osteopenia with degenerative changes of the spine and shoulders. There is elevation of the humeral heads compatible with chronic rotator cuff injury. The right humeral intra medullary rod noted through and old healed fracture. No acute fracture identified. IMPRESSION: No active disease. Electronically Signed   By: Elgie Collard M.D.   On: 06/16/2016 04:34  Dg Pelvis 1-2 Views  Result Date: 06/16/2016 CLINICAL DATA:  82 year old female with fall EXAM: PELVIS - 1-2 VIEW COMPARISON:  Radiograph dated 12/1911 FINDINGS: There is no acute fracture or dislocation. Old healed  fractures of the left pubic bone. There is osteopenia with degenerative changes of the lower lumbar spine and hip joints. The soft tissues appear unremarkable. There is moderate stool throughout the colon. IMPRESSION: No acute fracture or dislocation. Electronically Signed   By: Elgie Collard M.D.   On: 06/16/2016 04:36  Ct Head Wo Contrast  Result Date: 06/16/2016 CLINICAL DATA:  82 year old female with fall EXAM: CT HEAD WITHOUT CONTRAST CT CERVICAL SPINE WITHOUT CONTRAST TECHNIQUE: Multidetector CT imaging of the head and cervical spine was performed following the standard protocol without intravenous contrast. Multiplanar CT image reconstructions of the cervical spine were also generated. COMPARISON:  Head CT dated 01/07/12 FINDINGS: CT HEAD FINDINGS There is mild age-related atrophy and chronic microvascular ischemic changes. There is no acute intracranial hemorrhage. No mass effect or midline shift noted. There is chronic  opacification of the right maxillary sinus and multiple ethmoid air cells. No air-fluid levels. The remainder of the visualized paranasal sinuses and mastoid air cells are clear. The calvarium is intact. CT CERVICAL SPINE FINDINGS There is no acute fracture or subluxation of the cervical spine.There is osteopenia with multilevel degenerative changes of the cervical spine.The odontoid and spinous processes are intact.There is normal anatomic alignment of the C1-C2 lateral masses. The visualized soft tissues appear unremarkable. IMPRESSION: No acute intracranial hemorrhage. No acute/ traumatic cervical spine pathology. Electronically Signed   By: Elgie Collard M.D.   On: 06/16/2016 04:45  Ct Cervical Spine Wo Contrast  Result Date: 06/16/2016 CLINICAL DATA:  82 year old female with fall EXAM: CT HEAD WITHOUT CONTRAST CT CERVICAL SPINE WITHOUT CONTRAST TECHNIQUE: Multidetector CT imaging of the head and cervical spine was performed following the standard protocol without intravenous contrast. Multiplanar CT image reconstructions of the cervical spine were also generated. COMPARISON:  Head CT dated 01/07/12 FINDINGS: CT HEAD FINDINGS There is mild age-related atrophy and chronic microvascular ischemic changes. There is no acute intracranial hemorrhage. No mass effect or midline shift noted. There is chronic opacification of the right maxillary sinus and multiple ethmoid air cells. No air-fluid levels. The remainder of the visualized paranasal sinuses and mastoid air cells are clear. The calvarium is intact. CT CERVICAL SPINE FINDINGS There is no acute fracture or subluxation of the cervical spine.There is osteopenia with multilevel degenerative changes of the cervical spine.The odontoid and spinous processes are intact.There is normal anatomic alignment of the C1-C2 lateral masses. The visualized soft tissues appear unremarkable. IMPRESSION: No acute intracranial hemorrhage. No acute/ traumatic cervical spine  pathology. Electronically Signed   By: Elgie Collard M.D.   On: 06/16/2016 04:45   Procedures Procedures (including critical care time)  Medications Ordered in ED Medications - No data to display   Initial Impression / Assessment and Plan / ED Course  I have reviewed the triage vital signs and the nursing notes.  Pertinent labs & imaging results that were available during my care of the patient were reviewed by me and considered in my medical decision making (see chart for details).  Clinical Course  Fall Contusion  Patient sent to the ER for evaluation after falling out of bed today. She had a low-level fall from bed to a padded mat on the ground next to her bed. She was without any significant complaints at arrival. She did have bruising to her head, however, therefore CT scan of head and cervical spine were performed. Patient does have baseline dementia and it was not clear if she could appropriately answer questions. CTs were negative. Patient also  had x-rays performed as outlined above, no acute abnormality. Patient deemed safe for return to facility.  Final Clinical Impressions(s) / ED Diagnoses   Final diagnoses:  None    New Prescriptions New Prescriptions   No medications on file    I personally performed the services described in this documentation, which was scribed in my presence. The recorded information has been reviewed and is accurate.      Lisa Crease, MD 06/16/16 [phone]

## 2016-06-16 NOTE — ED Triage Notes (Signed)
Larey Seat out of bed with no injury noted pt has dementia and unable to tell staff if she is hurting. Pt is on aspirin.

## 2016-11-27 ENCOUNTER — Emergency Department (HOSPITAL_COMMUNITY): Payer: Medicare Other

## 2016-11-27 ENCOUNTER — Emergency Department (HOSPITAL_COMMUNITY)
Admission: EM | Admit: 2016-11-27 | Discharge: 2016-11-27 | Disposition: A | Discharge status: Home / Self Care | Payer: Medicare Other | Attending: Emergency Medicine | Admitting: Emergency Medicine

## 2016-11-27 ENCOUNTER — Encounter (HOSPITAL_COMMUNITY): Payer: Self-pay | Admitting: *Deleted

## 2016-11-27 DIAGNOSIS — Y999 Unspecified external cause status: Secondary | ICD-10-CM | POA: Diagnosis not present

## 2016-11-27 DIAGNOSIS — S0990XA Unspecified injury of head, initial encounter: Secondary | ICD-10-CM | POA: Insufficient documentation

## 2016-11-27 DIAGNOSIS — Y939 Activity, unspecified: Secondary | ICD-10-CM | POA: Insufficient documentation

## 2016-11-27 DIAGNOSIS — Z7982 Long term (current) use of aspirin: Secondary | ICD-10-CM | POA: Insufficient documentation

## 2016-11-27 DIAGNOSIS — S0093XA Contusion of unspecified part of head, initial encounter: Secondary | ICD-10-CM | POA: Diagnosis not present

## 2016-11-27 DIAGNOSIS — Z23 Encounter for immunization: Secondary | ICD-10-CM | POA: Insufficient documentation

## 2016-11-27 DIAGNOSIS — S61217A Laceration without foreign body of left little finger without damage to nail, initial encounter: Secondary | ICD-10-CM | POA: Diagnosis not present

## 2016-11-27 DIAGNOSIS — W19XXXA Unspecified fall, initial encounter: Secondary | ICD-10-CM

## 2016-11-27 DIAGNOSIS — W1800XA Striking against unspecified object with subsequent fall, initial encounter: Secondary | ICD-10-CM | POA: Insufficient documentation

## 2016-11-27 DIAGNOSIS — Y92129 Unspecified place in nursing home as the place of occurrence of the external cause: Secondary | ICD-10-CM | POA: Insufficient documentation

## 2016-11-27 DIAGNOSIS — I10 Essential (primary) hypertension: Secondary | ICD-10-CM | POA: Insufficient documentation

## 2016-11-27 DIAGNOSIS — Z79899 Other long term (current) drug therapy: Secondary | ICD-10-CM | POA: Insufficient documentation

## 2016-11-27 DIAGNOSIS — E039 Hypothyroidism, unspecified: Secondary | ICD-10-CM | POA: Insufficient documentation

## 2016-11-27 DIAGNOSIS — S6992XA Unspecified injury of left wrist, hand and finger(s), initial encounter: Secondary | ICD-10-CM | POA: Diagnosis present

## 2016-11-27 MED ORDER — TETANUS-DIPHTH-ACELL PERTUSSIS 5-2.5-18.5 LF-MCG/0.5 IM SUSP
0.5000 mL | Freq: Once | INTRAMUSCULAR | Status: AC
  Administered 2016-11-27: 0.5 mL via INTRAMUSCULAR
  Filled 2016-11-27: qty 0.5

## 2016-11-27 NOTE — Discharge Instructions (Signed)
Read the information below.  No acute fractures in hand. CT head and neck did not show any acute abnormalities.  Wounds were irrigated. Dermabond was applied to hand wounds. This will peel off with time.  Please follow up with regular doctor in 2-3 days for re-evaluation.  You may return to the Emergency Department at any time for worsening condition or any new symptoms that concern you. Return if develop fever, cough, shortness of breath, loss of consciousness, altered mental status, or any other new/concerning symptoms.

## 2016-11-27 NOTE — ED Notes (Signed)
Daughter at bedside. [facility]

## 2016-11-27 NOTE — ED Notes (Signed)
Pt taken to CT.

## 2016-11-27 NOTE — ED Triage Notes (Signed)
[facility] where pt fell onto the floor striking her head on a metal bed frame. Pt has two small laceration to L ring finger, with swelling to L middle finger and L hand. EMS reports lac above R ear. Hx of dementia

## 2016-11-27 NOTE — ED Provider Notes (Signed)
[facility]-EMERGENCY DEPT Provider Note   CSN: [id number] Arrival date & time: 11/27/16  4098     History   Chief Complaint Chief Complaint  Patient presents with  . Head Laceration    HPI [name] is a 83 y.o. female.  [name] is a 83 y.o. female with h/o Dementia, HLD, HTN, hypothyroidism presents to ED via EMS from [facility] s/p fall. Spoke with nursing staff at [facility] facility, CNA was attempting to get patient dressed this morning when she rolled out of bed hitting her head on the nightstand and then falling to the ground. No LOC. At baseline patient is alert to self and non-verbal. Pt takes 81mg  ASA, no other anti-coagulation therapy. No other complaints from facility at this time.   Level V caveat - pt with dementia. Alert to self.       Past Medical History:  Diagnosis Date  . Dementia   . Hyperlipidemia   . Hypertension   . Hypothyroidism   . Hypothyroidism   . Mitral valve prolapse   . Reflux   . Severe frontal headaches     Patient Active Problem List   Diagnosis Date Noted  . Hypertension   . Hyperlipidemia   . Dementia   . Severe frontal headaches   . Hypothyroidism   . Reflux   . Mitral valve prolapse     No past surgical history on file.  OB History    No data available       Home Medications    Prior to Admission medications   Medication Sig Start Date End Date Taking? Authorizing Provider  aspirin 81 MG chewable tablet Chew 81 mg by mouth daily.    Historical Provider, MD  atenolol (TENORMIN) 25 MG tablet Take 25 mg by mouth 2 (two) times daily.    Historical Provider, MD  atenolol (TENORMIN) 50 MG tablet Take 1 tablet (50 mg total) by mouth 2 (two) times daily. Patient not taking: Reported on 12/16/2015 04/22/11   Cassell Clement, MD  divalproex (DEPAKOTE ER) 500 MG 24 hr tablet Take 1 tablet (500 mg total) by mouth daily. Patient not taking: Reported on 06/16/2016 04/22/11 04/21/12  Cassell Clement, MD    erythromycin ophthalmic ointment Place a 1/2 inch ribbon of ointment into the lower eyelid four times daily for five days. Patient not taking: Reported on 06/16/2016 12/16/15   [facility] Ward, PA-C  furosemide (LASIX) 20 MG tablet Take 1 tablet on Monday, Wednesday, and Friday Patient taking differently: Take 10 mg by mouth daily.  03/13/11   Cassell Clement, MD  levothyroxine (SYNTHROID, LEVOTHROID) 112 MCG tablet Take 112 mcg by mouth daily before breakfast.    Historical Provider, MD  levothyroxine (SYNTHROID, LEVOTHROID) 150 MCG tablet TAKE ONE TABLET BY MOUTH DAILY Patient not taking: Reported on 12/16/2015 08/20/11   Cassell Clement, MD  Nutritional Supplements (NUTRITIONAL DRINK PO) Take 4 oz by mouth 3 (three) times daily. Mighty Shakes     Historical Provider, MD  omeprazole (PRILOSEC) 20 MG capsule TAKE ONE CAPSULE BY MOUTH DAILY Patient not taking: Reported on 12/16/2015 08/20/11   Cassell Clement, MD  Ostomy Supplies (SKIN PREP SPRAY) MISC Apply 1 application topically 3 (three) times daily. Apply to right inner heel three times daily    Historical Provider, MD  PARoxetine (PAXIL) 20 MG tablet Take 1 tablet (20 mg total) by mouth every morning. Patient not taking: Reported on 12/16/2015 04/22/11   Cassell Clement, MD  potassium  chloride (K-DUR,KLOR-CON) 10 MEQ tablet Take 10 mEq by mouth every Monday, Wednesday, and Friday. 05/29/16   Historical Provider, MD  ZINC OXIDE, TOPICAL, (SECURA PROTECTIVE) 10 % CREA Apply 1 application topically 3 (three) times daily. Apply three times daily during every shift--apply to sacrum.    Historical Provider, MD    Family History No family history on file.  Social History Social History  Substance Use Topics  . Smoking status: Never Smoker  . Smokeless tobacco: Never Used  . Alcohol use No     Allergies   Other and Penicillins   Review of Systems Review of Systems  Unable to perform ROS: Dementia     Physical Exam Updated Vital  Signs BP 139/57   Pulse 66   Temp 98.1 F (36.7 C)   Resp 16   SpO2 98%   Physical Exam  Constitutional: She appears cachectic. She appears ill ( chronically). No distress.  HENT:  Head: Normocephalic. Head is without raccoon's eyes and without Battle's sign.    Mouth/Throat: Oropharynx is clear and moist and mucous membranes are normal. No oropharyngeal exudate.  Eyes: Conjunctivae and EOM are normal. Pupils are equal, round, and reactive to light. Right eye exhibits no discharge. Left eye exhibits no discharge. No scleral icterus.  Neck: Normal range of motion and phonation normal. Neck supple. No neck rigidity. Normal range of motion present.  Cardiovascular: Normal rate, regular rhythm, normal heart sounds and intact distal pulses.   No murmur heard. Pulmonary/Chest: Effort normal and breath sounds normal. No stridor. No respiratory distress. She has no wheezes. She has no rales. She exhibits no tenderness.  Abdominal: Soft. Bowel sounds are normal. She exhibits no distension. There is no tenderness. There is no rigidity, no rebound, no guarding and no CVA tenderness.  Musculoskeletal: Normal range of motion.       Left hand: She exhibits laceration.  Left hand: two 0.5cm laceration to 5th digit (one at base of 4th digit, other at proximal phalanx). Patient moving all extremities.  No TTP of any other joints.   Lymphadenopathy:    She has no cervical adenopathy.  Neurological: She is alert.  Moves all extremities with ease.   Skin: Skin is warm and dry. She is not diaphoretic.  Psychiatric: She has a normal mood and affect. Her behavior is normal.     ED Treatments / Results  Labs (all labs ordered are listed, but only abnormal results are displayed) Labs Reviewed - No data to display  EKG  EKG Interpretation None       Radiology Ct Head Wo Contrast  Result Date: 11/27/2016 CLINICAL DATA:  Fall.  Head injury EXAM: CT HEAD WITHOUT CONTRAST CT CERVICAL SPINE WITHOUT  CONTRAST TECHNIQUE: Multidetector CT imaging of the head and cervical spine was performed following the standard protocol without intravenous contrast. Multiplanar CT image reconstructions of the cervical spine were also generated. COMPARISON:  CT head 06/16/2016 FINDINGS: CT HEAD FINDINGS Brain: Advanced atrophy. Negative for acute infarct, hemorrhage, or mass lesion. No shift of the midline structures. Vascular: No hyperdense vessel or unexpected calcification. Skull: Negative for fracture. Sinuses/Orbits: Complete opacification right maxillary sinus. This may be odontogenic with periapical lucency around right upper molar. Lucency extends into the maxillary sinus. This is unchanged from the prior CT. Mucosal edema extends into the right ethmoid sinus. Other: Soft tissue swelling right parietal scalp. CT CERVICAL SPINE FINDINGS Alignment: Mild anterolisthesis C4-5 and C6-7. Skull base and vertebrae: Negative for fracture. Soft tissues and  spinal canal: Negative Disc levels: Multilevel disc and facet degeneration throughout the cervical spine. Multilevel foraminal narrowing. No significant spinal stenosis. Upper chest: Lung apices clear. Other: None IMPRESSION: No acute intracranial abnormality. Generalized atrophy. Right parietal scalp hematoma Multilevel cervical spine degenerative change. Negative for fracture. Electronically Signed   By: Marlan Palau M.D.   On: 11/27/2016 07:44   Ct Cervical Spine Wo Contrast  Result Date: 11/27/2016 CLINICAL DATA:  Fall.  Head injury EXAM: CT HEAD WITHOUT CONTRAST CT CERVICAL SPINE WITHOUT CONTRAST TECHNIQUE: Multidetector CT imaging of the head and cervical spine was performed following the standard protocol without intravenous contrast. Multiplanar CT image reconstructions of the cervical spine were also generated. COMPARISON:  CT head 06/16/2016 FINDINGS: CT HEAD FINDINGS Brain: Advanced atrophy. Negative for acute infarct, hemorrhage, or mass lesion. No shift of the  midline structures. Vascular: No hyperdense vessel or unexpected calcification. Skull: Negative for fracture. Sinuses/Orbits: Complete opacification right maxillary sinus. This may be odontogenic with periapical lucency around right upper molar. Lucency extends into the maxillary sinus. This is unchanged from the prior CT. Mucosal edema extends into the right ethmoid sinus. Other: Soft tissue swelling right parietal scalp. CT CERVICAL SPINE FINDINGS Alignment: Mild anterolisthesis C4-5 and C6-7. Skull base and vertebrae: Negative for fracture. Soft tissues and spinal canal: Negative Disc levels: Multilevel disc and facet degeneration throughout the cervical spine. Multilevel foraminal narrowing. No significant spinal stenosis. Upper chest: Lung apices clear. Other: None IMPRESSION: No acute intracranial abnormality. Generalized atrophy. Right parietal scalp hematoma Multilevel cervical spine degenerative change. Negative for fracture. Electronically Signed   By: Marlan Palau M.D.   On: 11/27/2016 07:44   Dg Finger Middle Left  Result Date: 11/27/2016 CLINICAL DATA:  Fall with swelling to the left middle finger. EXAM: LEFT MIDDLE FINGER 2+V COMPARISON:  Left wrist 07/28/2009 FINDINGS: Degenerative changes in the interphalangeal joints. No evidence of acute fracture or dislocation of the left third finger. No focal bone lesion or bone destruction. Soft tissues are unremarkable. IMPRESSION: Degenerative changes in interphalangeal joints. No acute bony abnormalities. Electronically Signed   By: Burman Nieves M.D.   On: 11/27/2016 06:11    Procedures Procedures (including critical care time)  LACERATION REPAIR Performed by: Lona Kettle Authorized by: Lona Kettle Consent: Verbal consent obtained. Risks and benefits: risks, benefits and alternatives were discussed Consent given by: patient Patient identity confirmed: provided demographic data Prepped and Draped in normal sterile  fashion Wound explored  Laceration Location: left 5th digit  Laceration Length: 0.5cm x 2  No Foreign Bodies seen or palpated  Anesthesia: none  Local anesthetic: none  Anesthetic total: n/a  Irrigation method: syringe Amount of cleaning: standard  Skin closure: dermal  Number of sutures: dermabond  Technique: close  Patient tolerance: Patient tolerated the procedure well with no immediate complications.  Medications Ordered in ED Medications  Tdap (BOOSTRIX) injection 0.5 mL (not administered)     Initial Impression / Assessment and Plan / ED Course  I have reviewed the triage vital signs and the nursing notes.  Pertinent labs & imaging results that were available during my care of the patient were reviewed by me and considered in my medical decision making (see chart for details).  Clinical Course as of Nov 27 948  Wed Nov 27, 2016  0630 DG Finger Middle Left [AM]  0800 CT Head Wo Contrast [AM]  0800 CT Cervical Spine Wo Contrast [AM]    Clinical Course User Index [AM] Lona Kettle,  PA-C    Patient presents to ED via EMS from [facility] nursing facility s/p fall. Patient rolled out of bed while being dress this morning hitting her head on her nightstand. No LOC. Pt has history of dementia, at baseline nonverbal. Pt takes 81mg  ASA, no other anti-coagulation therapy. Patient is afebrile and non-toxic appearing in NAD. VSS.  No battle sign or raccoon eyes. Superficial abrasion to right parietal scalp, bleeding controlled, not amenable to suture or staple. Two 0.5cm lacerations to left 5th digit. Unknown tetanus status. X-ray of left middle finger shows no acute fracture or dislocation. CT head shows no acute intracranial abnormality - no hemorrhage, infarct, lesions, or skull fracture; chronic atrophy noted; right parietal scalp hematoma. CT neck shows no traumatic fracture or subluxation; degenerative changes noted.   Wounds cleaned and base of wounds visualized  in bloodless field. No foreign body visualized. Left hand lacerations closed with dermabond. Tdap updated. Pt safe for d/c back to facility. Follow up with PCP within 2-3 days for re-assessment. Return precautions given.     Final Clinical Impressions(s) / ED Diagnoses   Final diagnoses:  Fall, initial encounter    New Prescriptions New Prescriptions   No medications on file     Lona Kettle, PA-C 11/27/16 4098    Zadie Rhine, MD 11/27/16 [phone]

## 2016-11-27 NOTE — ED Notes (Signed)
Patient is alert however is not verbal. Per report this is patients baseline

## 2016-11-27 NOTE — ED Notes (Signed)
Spoke with patient  Daughter , will be coming , states patient is normally nonverbal. And normally runs a low pressure.

## 2016-11-27 NOTE — ED Notes (Signed)
Waiting on daughters arrival

## 2017-03-16 ENCOUNTER — Encounter (HOSPITAL_COMMUNITY): Payer: Self-pay | Admitting: Emergency Medicine

## 2017-03-16 ENCOUNTER — Emergency Department (HOSPITAL_COMMUNITY)
Admission: EM | Admit: 2017-03-16 | Discharge: 2017-03-16 | Disposition: A | Discharge status: Home / Self Care | Payer: Medicare Other | Attending: Emergency Medicine | Admitting: Emergency Medicine

## 2017-03-16 DIAGNOSIS — Y939 Activity, unspecified: Secondary | ICD-10-CM | POA: Insufficient documentation

## 2017-03-16 DIAGNOSIS — R600 Localized edema: Secondary | ICD-10-CM | POA: Insufficient documentation

## 2017-03-16 DIAGNOSIS — Y92129 Unspecified place in nursing home as the place of occurrence of the external cause: Secondary | ICD-10-CM | POA: Insufficient documentation

## 2017-03-16 DIAGNOSIS — Y999 Unspecified external cause status: Secondary | ICD-10-CM | POA: Diagnosis not present

## 2017-03-16 DIAGNOSIS — N189 Chronic kidney disease, unspecified: Secondary | ICD-10-CM | POA: Insufficient documentation

## 2017-03-16 DIAGNOSIS — E039 Hypothyroidism, unspecified: Secondary | ICD-10-CM | POA: Insufficient documentation

## 2017-03-16 DIAGNOSIS — S0181XA Laceration without foreign body of other part of head, initial encounter: Secondary | ICD-10-CM | POA: Diagnosis not present

## 2017-03-16 DIAGNOSIS — Z7982 Long term (current) use of aspirin: Secondary | ICD-10-CM | POA: Diagnosis not present

## 2017-03-16 DIAGNOSIS — W050XXA Fall from non-moving wheelchair, initial encounter: Secondary | ICD-10-CM | POA: Insufficient documentation

## 2017-03-16 DIAGNOSIS — S0990XA Unspecified injury of head, initial encounter: Secondary | ICD-10-CM

## 2017-03-16 DIAGNOSIS — I129 Hypertensive chronic kidney disease with stage 1 through stage 4 chronic kidney disease, or unspecified chronic kidney disease: Secondary | ICD-10-CM | POA: Diagnosis not present

## 2017-03-16 DIAGNOSIS — W19XXXA Unspecified fall, initial encounter: Secondary | ICD-10-CM

## 2017-03-16 HISTORY — DX: Disorder of kidney and ureter, unspecified: N28.9

## 2017-03-16 NOTE — ED Notes (Signed)
Bed: [facility] Expected date:  Expected time:  Means of arrival:  Comments: EMS 83 yo female fall-head lac

## 2017-03-16 NOTE — Discharge Instructions (Signed)
Ms. Rinck stitches will need to come out in 5-7 days.

## 2017-03-16 NOTE — ED Notes (Signed)
[facility] called for discharge transport 

## 2017-03-16 NOTE — ED Triage Notes (Signed)
Pt from [facility] following a witnessed fall where she slipped out of her wheelchair and fell forward. Bleeding is controlled with a pressure dressing. Per staff pt is at baseline and is contracted at baseline as well. Pt does not take blood thinners. Per EMS, laceration is in middle of forehead and is about 2-3inches long.

## 2017-03-16 NOTE — ED Provider Notes (Signed)
[facility]-EMERGENCY DEPT Provider Note   CSN: [id number] Arrival date & time: 03/16/17  2032     History   Chief Complaint Chief Complaint  Patient presents with  . Fall  . Head Laceration    HPI [name] is a 83 y.o. female.  The history is provided by the EMS personnel, a relative and the nursing home. No language interpreter was used.  Fall   Head Laceration     [name] is a 83 y.o. female who presents to the Emergency Department complaining of fall, head laceration.  Level V caveat due to dementia.  She presents from her nursing home for evaluation of injuries following a fall.  Per report she was sitting in a wheelchair when she slid forward, striking her head on the ground. At baseline she is nonverbal and nonambulatory. Discussed patient's presentation with her daughter and power of attorney. Daughter does not want any imaging performed at this time, patient is DNR/DNI.  Past Medical History:  Diagnosis Date  . Dementia   . Hyperlipidemia   . Hypertension   . Hypothyroidism   . Hypothyroidism   . Mitral valve prolapse   . Reflux   . Renal disorder    Chronic kidney disease  . Severe frontal headaches     Patient Active Problem List   Diagnosis Date Noted  . Hypertension   . Hyperlipidemia   . Dementia   . Severe frontal headaches   . Hypothyroidism   . Reflux   . Mitral valve prolapse     History reviewed. No pertinent surgical history.  OB History    No data available       Home Medications    Prior to Admission medications   Medication Sig Start Date End Date Taking? Authorizing Provider  atenolol (TENORMIN) 25 MG tablet Take 25 mg by mouth 2 (two) times daily.   Yes Historical Provider, MD  doxycycline (VIBRAMYCIN) 100 MG capsule Take 100 mg by mouth 2 (two) times daily. 03/06/17  Yes Historical Provider, MD  furosemide (LASIX) 20 MG tablet Take 1 tablet on Monday, Wednesday, and Friday Patient taking differently: Take 10 mg by mouth  daily.  03/13/11  Yes Cassell Clement, MD  ketoconazole (NIZORAL) 2 % shampoo Apply 1 application topically 3 (three) times a week. Apply to scalp 3x weekly (Mon, Wed, Fri) 03/06/17  Yes Historical Provider, MD  levothyroxine (SYNTHROID, LEVOTHROID) 112 MCG tablet Take 112 mcg by mouth daily before breakfast.   Yes Historical Provider, MD  Nutritional Supplements (NUTRITIONAL DRINK PO) Take 4 oz by mouth 3 (three) times daily. Mighty Shakes    Yes Historical Provider, MD  nystatin cream (MYCOSTATIN) Apply 1 application topically 2 (two) times daily as needed for dry skin. Apply to perineum twice daily   Yes Historical Provider, MD  Skin Protectants, Misc. (EUCERIN) cream Apply 1 application topically as needed for dry skin. Apply to BLE daily for dry skin   Yes Historical Provider, MD  ZINC OXIDE, TOPICAL, (SECURA PROTECTIVE) 10 % CREA Apply 1 application topically 3 (three) times daily. Apply three times daily during every shift--apply to sacrum.   Yes Historical Provider, MD  aspirin 81 MG chewable tablet Chew 81 mg by mouth daily.    Historical Provider, MD  atenolol (TENORMIN) 50 MG tablet Take 1 tablet (50 mg total) by mouth 2 (two) times daily. Patient not taking: Reported on 03/16/2017 04/22/11   Cassell Clement, MD  divalproex (DEPAKOTE ER) 500 MG 24 hr  tablet Take 1 tablet (500 mg total) by mouth daily. Patient not taking: Reported on 06/16/2016 04/22/11 04/21/12  Cassell Clement, MD  erythromycin ophthalmic ointment Place a 1/2 inch ribbon of ointment into the lower eyelid four times daily for five days. Patient not taking: Reported on 06/16/2016 12/16/15   [facility] Ward, PA-C  levothyroxine (SYNTHROID, LEVOTHROID) 150 MCG tablet TAKE ONE TABLET BY MOUTH DAILY Patient not taking: Reported on 12/16/2015 08/20/11   Cassell Clement, MD  omeprazole (PRILOSEC) 20 MG capsule TAKE ONE CAPSULE BY MOUTH DAILY Patient not taking: Reported on 12/16/2015 08/20/11   Cassell Clement, MD  PARoxetine (PAXIL)  20 MG tablet Take 1 tablet (20 mg total) by mouth every morning. Patient not taking: Reported on 12/16/2015 04/22/11   Cassell Clement, MD    Family History No family history on file.  Social History Social History  Substance Use Topics  . Smoking status: Never Smoker  . Smokeless tobacco: Never Used  . Alcohol use No     Allergies   Other and Penicillins   Review of Systems Review of Systems  Unable to perform ROS: Dementia     Physical Exam Updated Vital Signs BP (!) 177/98 (BP Location: Left Arm)   Pulse 93   Resp 14   SpO2 99%   Physical Exam  Constitutional: She appears well-developed and well-nourished.  HENT:  Head: Normocephalic.  Irregular laceration approximately 4 cm to the central forehead.  Eyes: Pupils are equal, round, and reactive to light.  Cardiovascular: Normal rate and regular rhythm.   No murmur heard. Pulmonary/Chest: Effort normal and breath sounds normal. No respiratory distress.  Abdominal: Soft. There is no tenderness. There is no rebound and no guarding.  Musculoskeletal: She exhibits no tenderness.  Bilateral lower extremity edema, right greater than left. No tenderness to the arms, legs, pelvis.  Neurological: She is alert.  Disoriented to place and time. Mumbling speech. Does not follow commands. Does not move lower extremities. There is weakness of bilateral upper Sherman knees, left greater than right.  Skin: Skin is warm and dry.  Psychiatric: She has a normal mood and affect. Her behavior is normal.  Nursing note and vitals reviewed.    ED Treatments / Results  Labs (all labs ordered are listed, but only abnormal results are displayed) Labs Reviewed - No data to display  EKG  EKG Interpretation None       Radiology No results found.  Procedures .Marland KitchenLaceration Repair Date/Time: 03/16/2017 11:16 PM Performed by: Tilden Fossa Authorized by: Tilden Fossa   Consent:    Consent obtained:  Verbal   Consent given  by: Daughter.   Risks discussed:  Poor cosmetic result, poor wound healing and need for additional repair   Alternatives discussed:  No treatment Anesthesia (see MAR for exact dosages):    Anesthesia method:  None Laceration details:    Location: Forehead.   Wound length (cm): 4. Repair type:    Repair type:  Intermediate Pre-procedure details:    Preparation:  Patient was prepped and draped in usual sterile fashion Exploration:    Hemostasis achieved with:  Direct pressure   Wound exploration: entire depth of wound probed and visualized     Contaminated: no   Treatment:    Area cleansed with:  Hibiclens and saline   Amount of cleaning:  Standard   Visualized foreign bodies/material removed: no   Skin repair:    Repair method:  Sutures   Suture size:  5-0   Suture  material:  Prolene   Suture technique:  Simple interrupted   Number of sutures:  5 Approximation:    Approximation:  Close Post-procedure details:    Dressing:  Antibiotic ointment   Patient tolerance of procedure:  Tolerated well, no immediate complications   (including critical care time)  Medications Ordered in ED Medications - No data to display   Initial Impression / Assessment and Plan / ED Course  I have reviewed the triage vital signs and the nursing notes.  Pertinent labs & imaging results that were available during my care of the patient were reviewed by me and considered in my medical decision making (see chart for details).     Patient here for evaluation of injuries on a fall out of her wheelchair. She does have an irregular laceration to the forehead. Discussed the patient with her daughter. Daughter does not desire to pursue any workup in the emergency department. Wound repaired per procedure note. Patient does have an anaphylactic allergy to "caine" anesthetics. Wound was repaired without local anesthesia and analgesia and patient did not appear to be uncomfortable during repair.  Plan to d/c  home with outpatient follow up.    Final Clinical Impressions(s) / ED Diagnoses   Final diagnoses:  None    New Prescriptions New Prescriptions   No medications on file     Tilden Fossa, MD 03/16/17 2319

## 2017-04-18 DEATH — deceased
